# Patient Record
Sex: Male | Born: 1940
Health system: Southern US, Community
[De-identification: ages and names within clinical notes are randomized; demographics above are authoritative.]

## PROBLEM LIST (undated history)

## (undated) DIAGNOSIS — I214 Non-ST elevation (NSTEMI) myocardial infarction: Secondary | ICD-10-CM

## (undated) DIAGNOSIS — E785 Hyperlipidemia, unspecified: Secondary | ICD-10-CM

## (undated) DIAGNOSIS — I10 Essential (primary) hypertension: Secondary | ICD-10-CM

---

## 1998-05-27 ENCOUNTER — Encounter: Payer: Self-pay | Admitting: Emergency Medicine

## 1998-05-27 ENCOUNTER — Ambulatory Visit (HOSPITAL_BASED_OUTPATIENT_CLINIC_OR_DEPARTMENT_OTHER): Admission: RE | Admit: 1998-05-27 | Discharge: 1998-05-27 | Payer: Self-pay | Admitting: Orthopedic Surgery

## 1998-05-27 ENCOUNTER — Emergency Department (HOSPITAL_COMMUNITY): Admission: EM | Admit: 1998-05-27 | Discharge: 1998-05-27 | Payer: Self-pay | Admitting: Emergency Medicine

## 2003-06-16 ENCOUNTER — Ambulatory Visit (HOSPITAL_COMMUNITY): Admission: RE | Admit: 2003-06-16 | Discharge: 2003-06-16 | Payer: Self-pay | Admitting: Family Medicine

## 2003-06-25 ENCOUNTER — Ambulatory Visit (HOSPITAL_COMMUNITY): Admission: RE | Admit: 2003-06-25 | Discharge: 2003-06-25 | Payer: Self-pay | Admitting: Family Medicine

## 2003-07-24 ENCOUNTER — Ambulatory Visit (HOSPITAL_COMMUNITY): Admission: RE | Admit: 2003-07-24 | Discharge: 2003-07-24 | Payer: Self-pay | Admitting: Family Medicine

## 2003-08-03 ENCOUNTER — Ambulatory Visit (HOSPITAL_COMMUNITY): Admission: RE | Admit: 2003-08-03 | Discharge: 2003-08-03 | Payer: Self-pay | Admitting: Family Medicine

## 2003-08-15 ENCOUNTER — Encounter (INDEPENDENT_AMBULATORY_CARE_PROVIDER_SITE_OTHER): Payer: Self-pay | Admitting: *Deleted

## 2003-08-15 ENCOUNTER — Inpatient Hospital Stay (HOSPITAL_COMMUNITY): Admission: RE | Admit: 2003-08-15 | Discharge: 2003-08-23 | Payer: Self-pay | Admitting: Thoracic Surgery

## 2003-08-29 ENCOUNTER — Encounter: Admission: RE | Admit: 2003-08-29 | Discharge: 2003-08-29 | Payer: Self-pay | Admitting: Thoracic Surgery

## 2003-09-13 ENCOUNTER — Encounter: Admission: RE | Admit: 2003-09-13 | Discharge: 2003-09-13 | Payer: Self-pay | Admitting: Thoracic Surgery

## 2003-10-11 ENCOUNTER — Encounter: Admission: RE | Admit: 2003-10-11 | Discharge: 2003-10-11 | Payer: Self-pay | Admitting: Thoracic Surgery

## 2003-12-18 ENCOUNTER — Encounter: Admission: RE | Admit: 2003-12-18 | Discharge: 2003-12-18 | Payer: Self-pay | Admitting: Thoracic Surgery

## 2005-05-11 ENCOUNTER — Encounter: Admission: RE | Admit: 2005-05-11 | Discharge: 2005-05-11 | Payer: Self-pay | Admitting: Family Medicine

## 2005-06-19 IMAGING — CT CT CHEST W/ CM
1 of 2 series · 15 of 32 positions shown, 19 images · IV contrast (omnipaque)
Comparison: none

CLINICAL DATA: Abnormal chest x-ray.  Partial right lower lobe and right middle atelectasis.
 CT OF THE CHEST WITH CONTRAST
 Multidetector helical scans through the chest were performed after IV contrast media was given.  100 cc of Omnipaque 300 were given as the contrast media.
 At the right lung base, there is a low attenuation fluid collection present with some internal septations particularly dependently.  This collection has an attenuation of 21 Hounsfield units and in its largest axial plan measures approximately 10.5 x 13.5 cm.  The pleura does appear to be enhancing as are some of the septations and empyema is favored over loculated pleural effusion at the right lung base.  There is partial right middle lobe and right lower lobe atelectasis.  The left lung is clear.  No mediastinal or hilar adenopathy is seen.  The pulmonary arteries and thoracic aorta opacify normally.  
 IMPRESSION
 Fluid collection in the right lung base most consistent with empyema versus loculated effusion with internal septations.  There is right middle lobe and right lower lobe atelectasis as a result of this fluid collection.

[Series 2: chest routine 5.0 b30f · axial · 0.62mm/px · z∈[-428,-78]mm · 15 of 82 slices shown, 19 images]
[im 6/82  mediastinal]
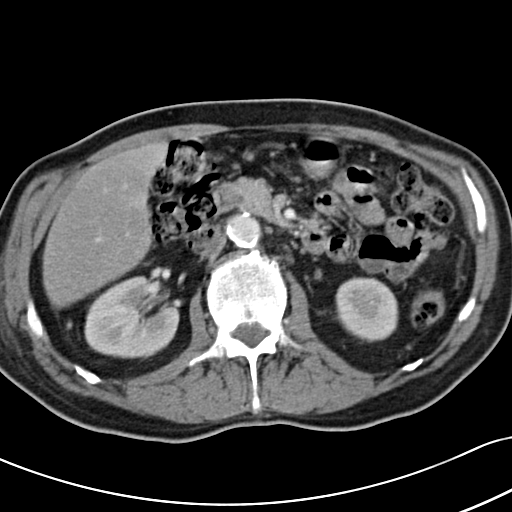
[im 6/82  lung]
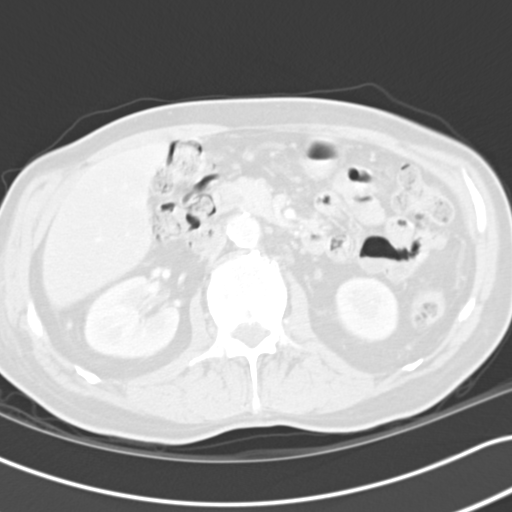
[im 11/82  lung]
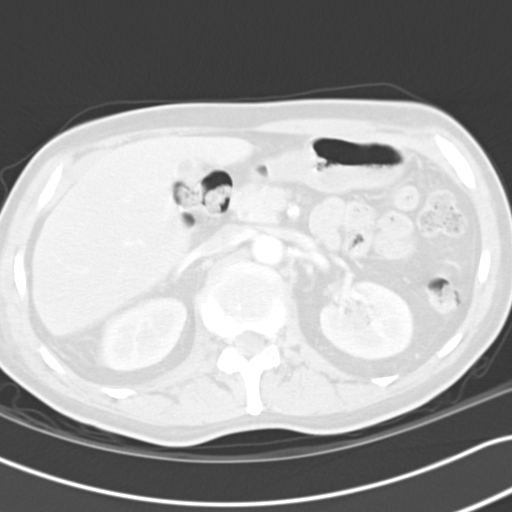
[im 17/82  lung]
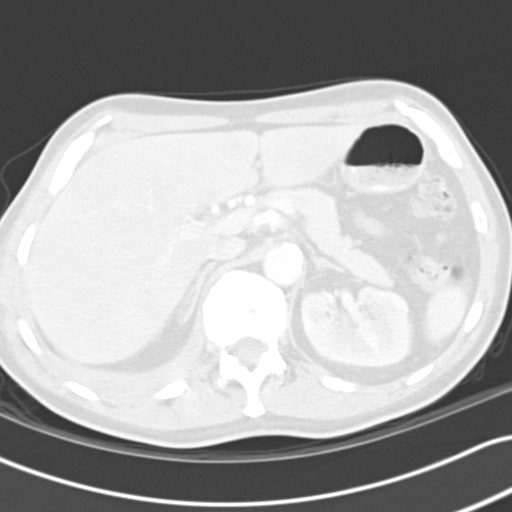
[im 22/82  lung]
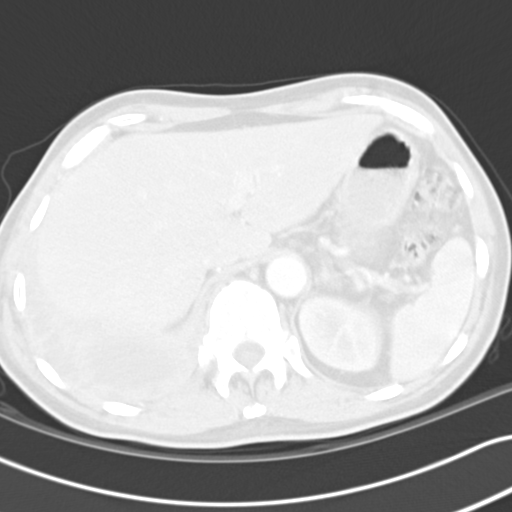
[im 28/82  mediastinal]
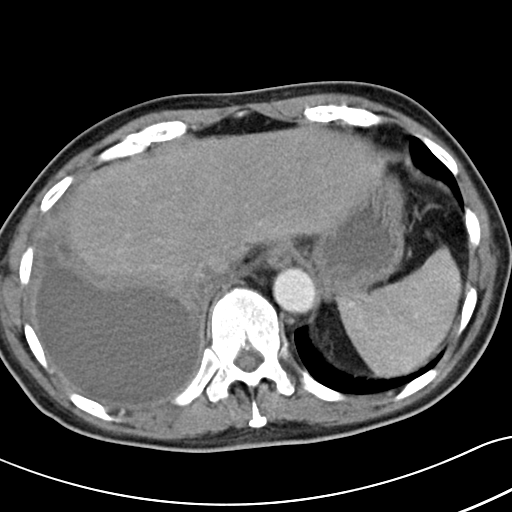
[im 28/82  lung]
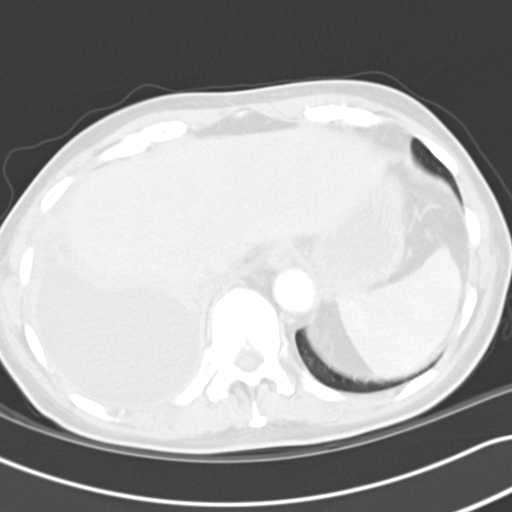
[im 33/82  lung]
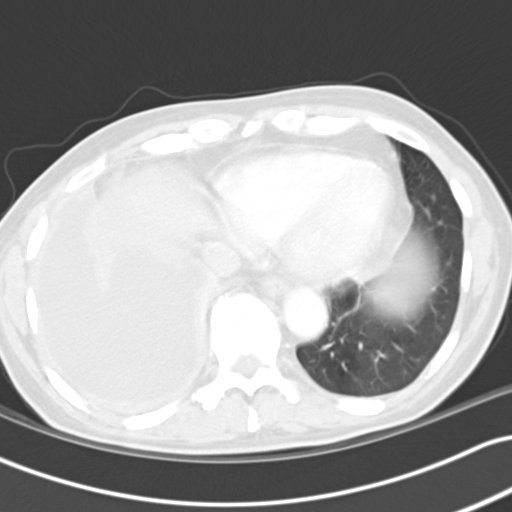
[im 38/82  lung]
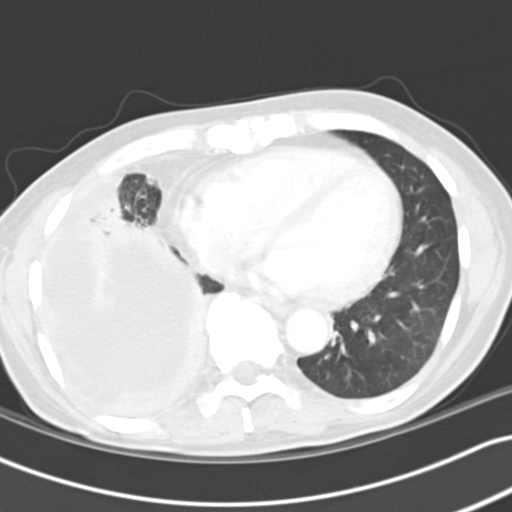
[im 41/82  lung]
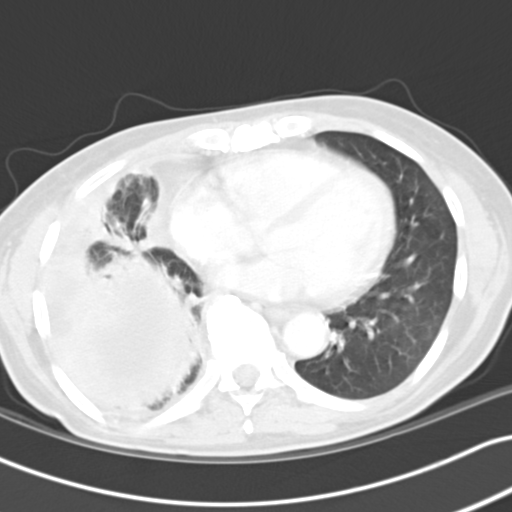
[im 44/82  mediastinal]
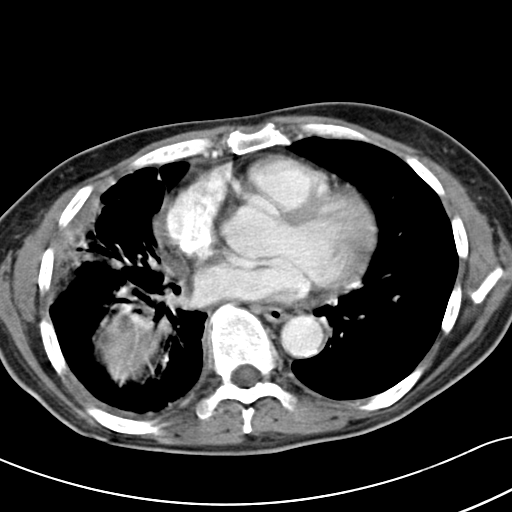
[im 44/82  lung]
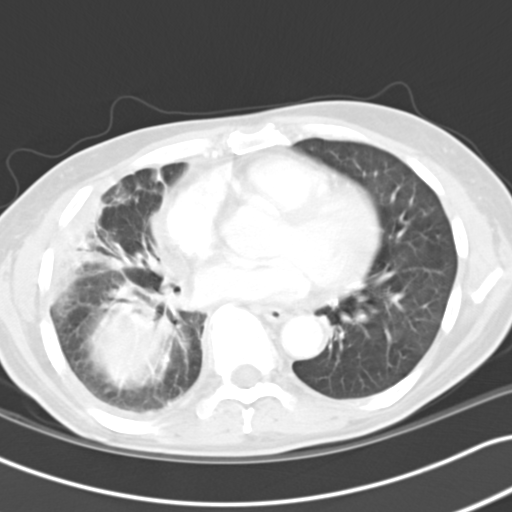
[im 49/82  lung]
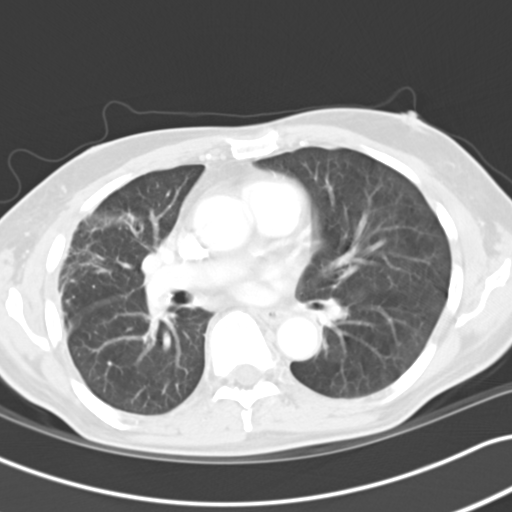
[im 55/82  lung]
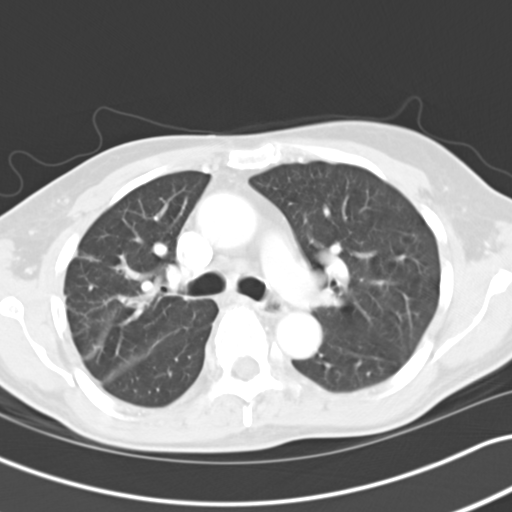
[im 60/82  lung]
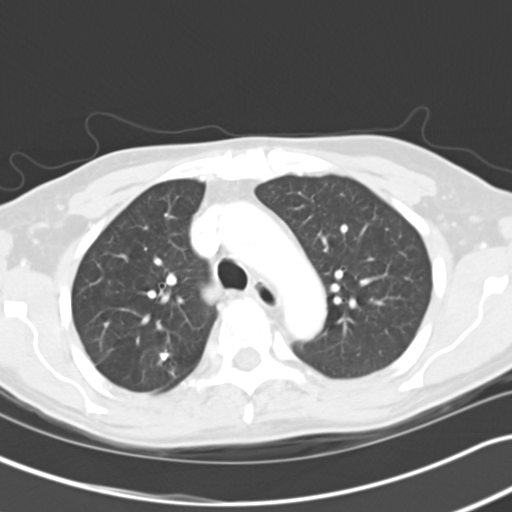
[im 65/82  mediastinal]
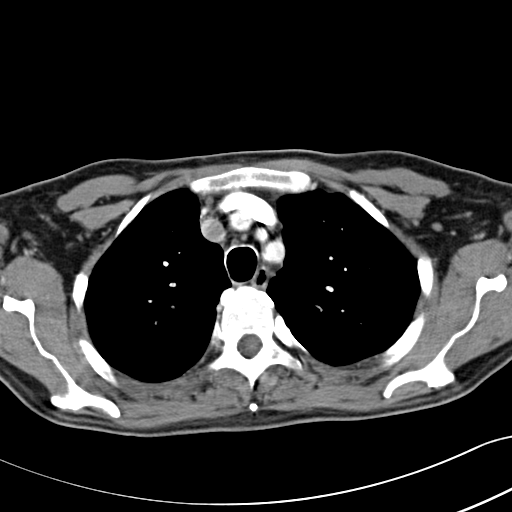
[im 65/82  lung]
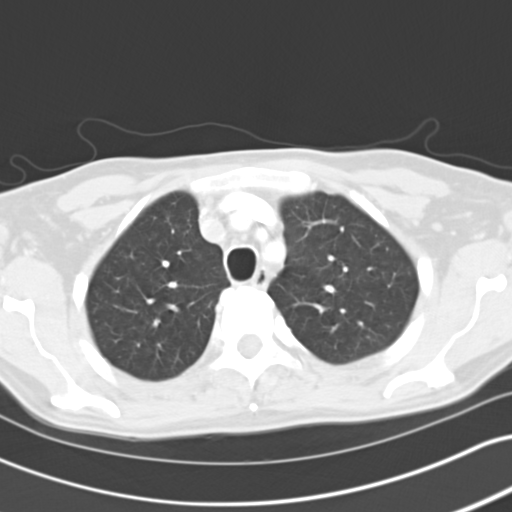
[im 71/82  lung]
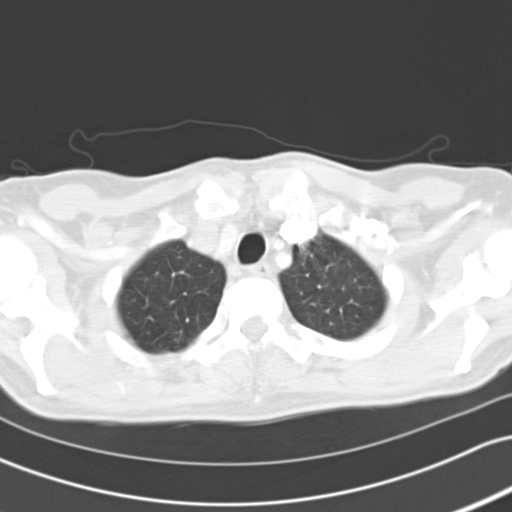
[im 76/82  lung]
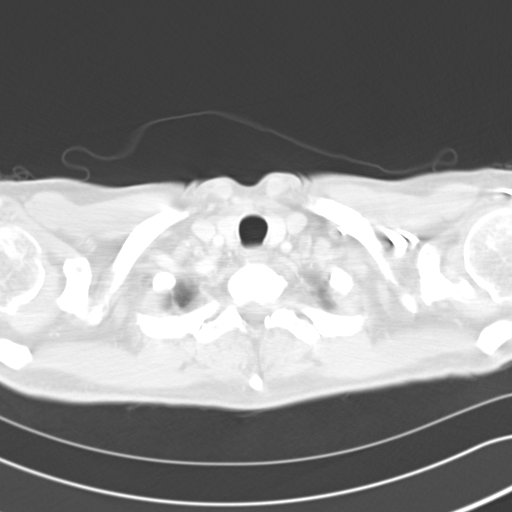

[15 of 32 positions shown; findings below may reference images not displayed]

## 2005-07-15 IMAGING — CR DG CHEST 2V
2 series · 2 of 2 positions shown · non-contrast
Comparison: none

CLINICAL DATA: Empyema with fistula, postop.
 TWO VIEW CHEST
 PA and lateral views of the chest are made and are compared to previous studies of 08/22/03 at 8445 hours from [HOSPITAL] and show decrease in the amount of fluid in the region of the right base and the right mid lung field.  There remains some blunting of the right costophrenic angle and there appears to be some pleural thickening.   The aorta is mildly elongated and minimally calcified.  The heart is not significantly enlarged.  The bones show minimal degenerative hypertrophic spurring.
 IMPRESSION
 Improved aeration, right mid and lower lung.

[view not recorded (1 of 2)]
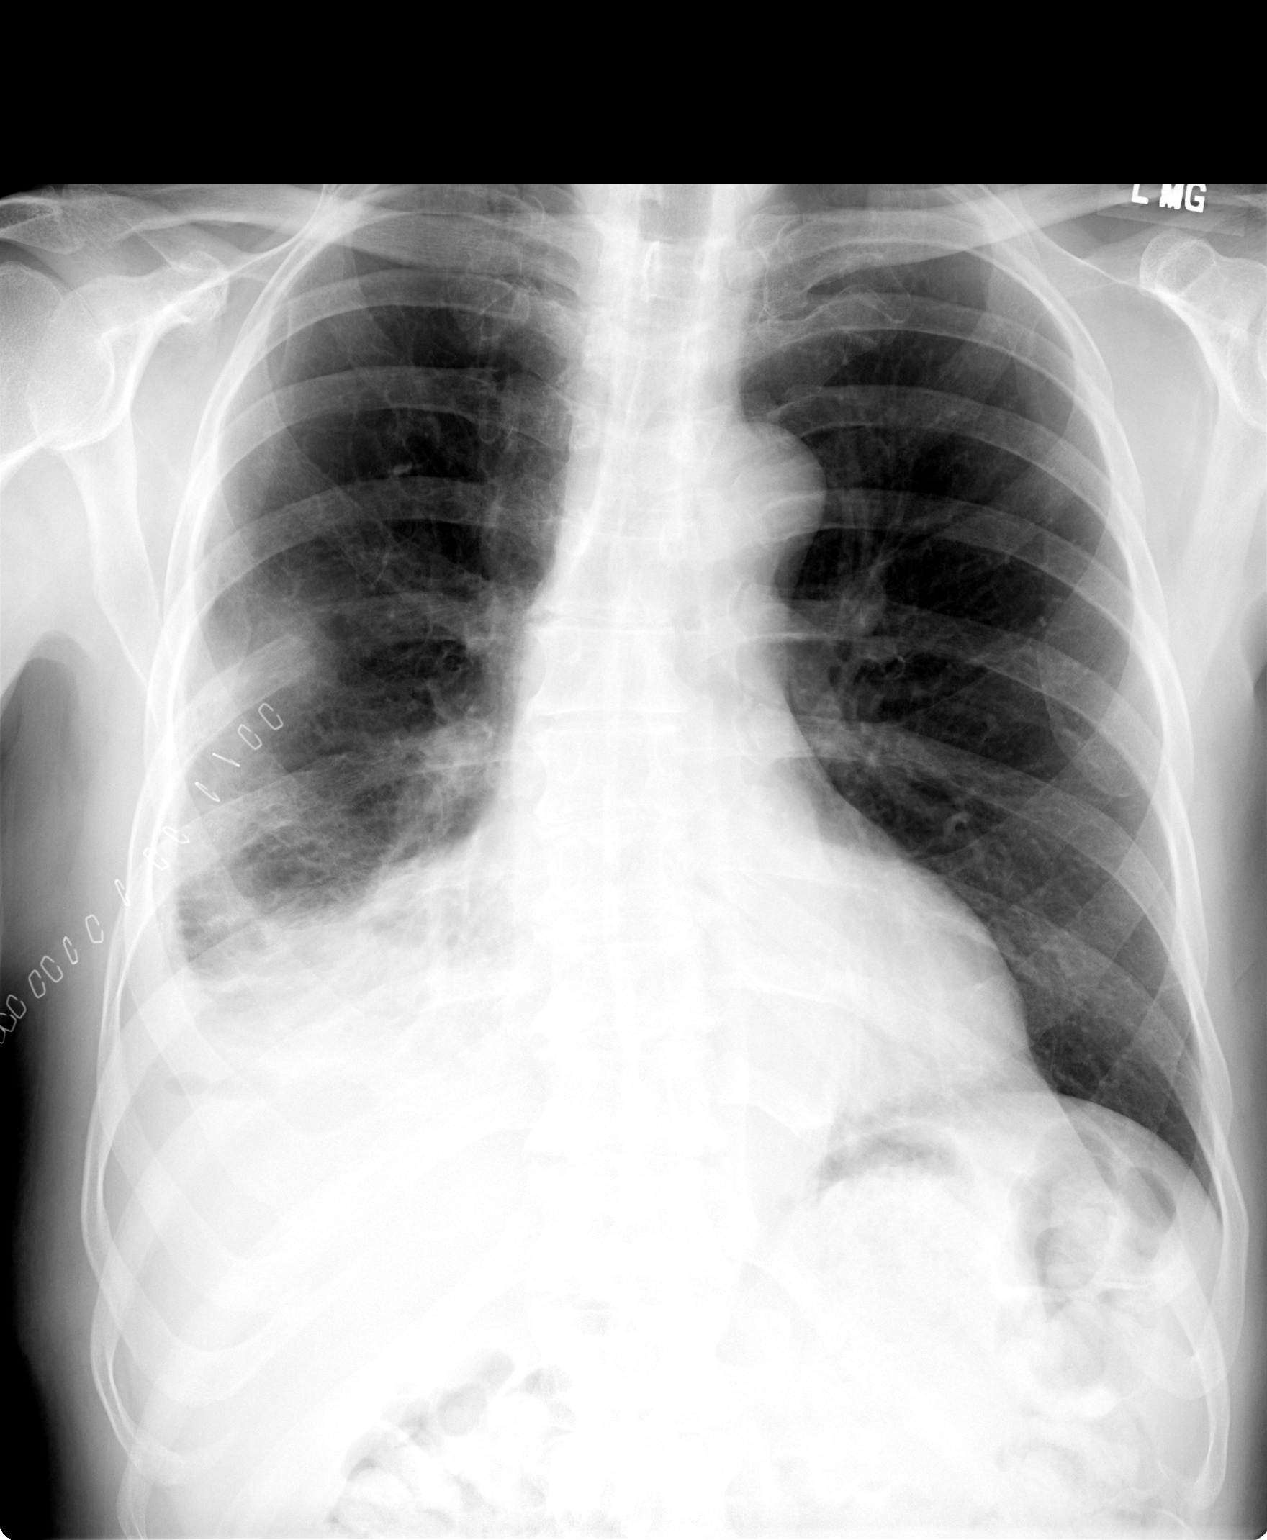

[view not recorded (2 of 2)]
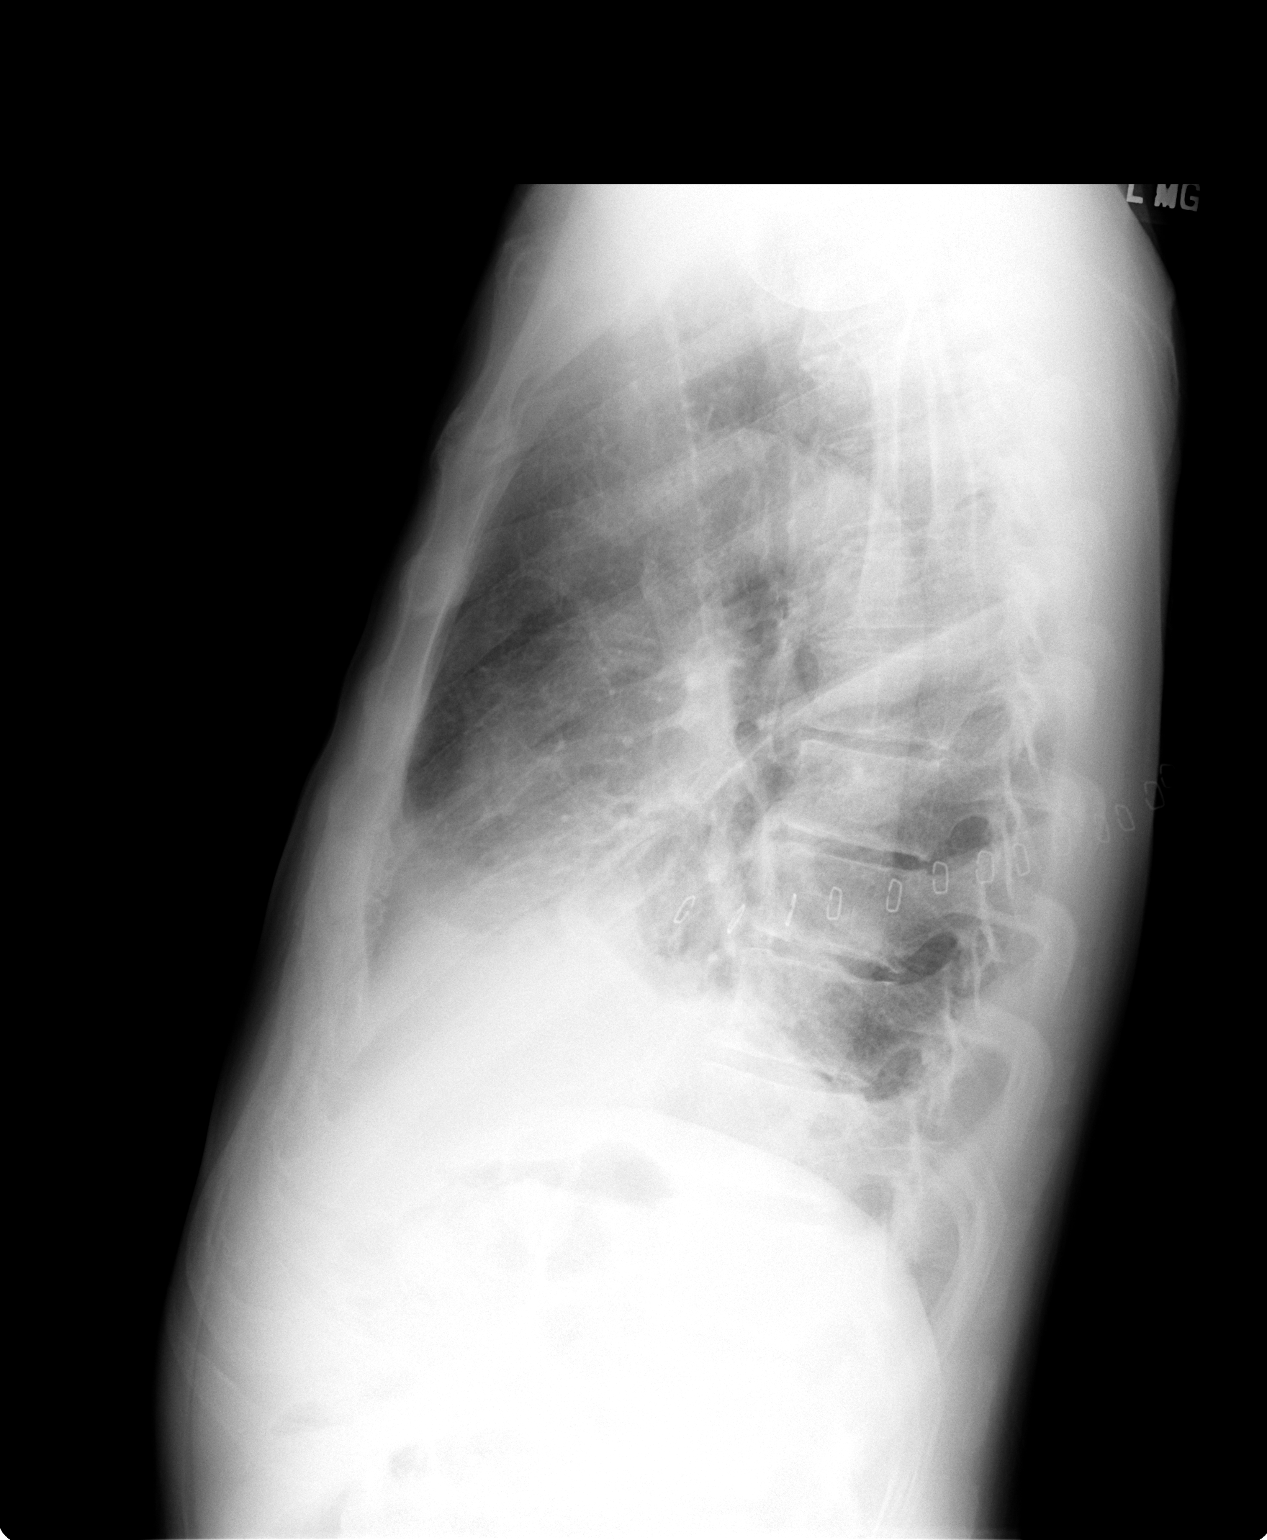

[2 of 2 positions shown; findings below may reference images not displayed]

## 2008-09-05 ENCOUNTER — Encounter: Admission: RE | Admit: 2008-09-05 | Discharge: 2008-09-05 | Payer: Self-pay | Admitting: Family Medicine

## 2008-10-18 DEATH — deceased

## 2010-12-05 NOTE — Discharge Summary (Signed)
NAMEJISHNU, [name]                         ACCOUNT NO.:  [id number]   MEDICAL RECORD NO.:  [id number]                   PATIENT TYPE:  INP   LOCATION:  2009                                 FACILITY:  [facility]   PHYSICIAN:  Ines Bloomer, M.D.              DATE OF BIRTH:  February 11, 1941   DATE OF ADMISSION:  08/15/2003  DATE OF DISCHARGE:  08/22/2003                                 DISCHARGE SUMMARY   ADMISSION DIAGNOSIS:  Right lower lobe empyema.   DISCHARGE DIAGNOSES:  1. 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  2. History of pneumonia.   HOSPITAL PROCEDURES:  1. Right video-assisted thoracoscopy, right thoracotomy, drainage of chronic     empyema and decortication of the right chest, completed on August 15, 2003 by Dr. Edwyna Shell.  2. Transfusion of packed red blood cells.   CONSULTATIONS:  1. Infectious disease.  2. Diabetes management.  3. Mobility team.   HISTORY OF PRESENT ILLNESS:  [name] is a very pleasant 70 year old male  who was referred to Dr. Edwyna Shell by Dr. Tiburcio Pea for evaluation of a right lower  lobe empyema. The patient had pneumonia in November 2004 and was treated  with antibiotics including Zithromax. The right upper lobe was cleared of  the pneumonia, but patient continued to have right middle and lower lobe  consolidation. The patient was thought to have a pleural effusion, and a CT  scan was completed for further evaluation. CT scan showed chronic empyema of  the right lower lobe. The patient continued to have intermittent chills and  felt general malaise at the time of presentation. The patient was seen by  Dr. Edwyna Shell in clinic, and he felt it was appropriate for the patient to be  admitted to [facility] for right VATS and drainage of the empyema  and decortication. The plan was for the patient to be taken to the operating  room then on August 15, 2003.   HOSPITAL COURSE:  [name] was admitted to [facility] on August 15, 2003 and was taken to the operating room. He underwent a right VATS,  right thoracotomy, drainage of chronic empyema, and decortication of the  right chest. The patient tolerated the procedure well and was transferred to  post anesthesia care unit in stable condition. Postoperatively, the patient  was found to have hemoglobin of 7.9 for which he was transfused one unit of  packed red blood cells. Otherwise, the patient's chest x-ray was stable, he  was afebrile, and his white count trended towards normal.   Over the next several hospital days, the patient continued to do fairly  well. His chest tubes were discontinued in stepwise fashion as the output  decreased, and there was no air leak. The patient's chest x-ray remained  stable. The patient's H and H continued  to trend towards normal. The patient  was saturating well on room air by postoperative day #2.   On postoperative day #5, an infectious disease consult was initiated for the  treatment plan for the patient's strep pneumoniae empyema. Dr. Roxan Hockey  responded to the consultation appropriately and recommended Rocephin during  patient's hospitalization which could be converted to an oral Keflex at the  time of discharge and to be continued for four to six weeks.   Otherwise, the patient was deemed appropriate for discharge planning on  postoperative day #6 or August 21, 2003. He continued to be afebrile, and  his vital signs were stable. He was saturating at 95% on room air. The  patient's chest x-ray showed no change in the infiltrates on exam. The  patient's heart was in a regular rate and rhythm and was in normal sinus  rhythm on telemetry. His incisions were healing well without signs of  infection. The patient was ambulating independently in the hallways without  difficulty. Plan for IV antibiotics to be continued until the patient's  discharge, and then at that  time, his antibiotics would be converted to oral  Keflex. The patient was tolerating a regular diet. His bowels were regular,  and he was urinating without difficulty.   We will continue as planned with discharge to home on postoperative day #7  or August 22, 2003 pending a.m. rounds and no change in the patient's  mental status.   ALLERGIES:  PENICILLIN reportedly causes whelps and swelling, although the  patient tolerated IV Zinacef during his hospitalization.   DISCHARGE MEDICATIONS:  1. Keflex 500 mg p.o. b.i.d. for 5 weeks.  2. Ferrous sulfate 325 mg p.o. t.i.d. for 2 weeks.  3. Tylox one to two tablets every four to six hours as needed for pain.   DISCHARGE INSTRUCTIONS:  1. Activity. The patient should avoid driving. He should avoid heavy lifting     or strenuous activity. He should continue to walk daily. He should     continue his breathing exercises.  2. Diet. The patient has no restrictions.  3. Wound care. The patient may shower. He is to wash his incisions daily     with soap and water.  4. Special instructions. The patient was to notify [facility] if he has any     increased redness, swelling, or drainage from his incisions or has a     temperature greater than 101.   FOLLOWUP APPOINTMENT:  The patient should see Dr. Edwyna Shell on Wednesday  February 9 at 4:30 p.m. with a chest x-ray at [facility]  on that same day at 3:30 p.m. The patient is to hand carry the chest x-ray  to Dr. Scheryl Darter appointment.      Carolyn A. Eustaquio Boyden.                  Ines Bloomer, M.D.    CAF/MEDQ  D:  08/21/2003  T:  08/22/2003  Job:  [id number]   cc:   Tiburcio Pea, M.D.

## 2010-12-05 NOTE — Op Note (Signed)
NAME:  [name], [name]                       ACCOUNT NO.:  [id number]   MEDICAL RECORD NO.:  [id number]                   PATIENT TYPE:  INP   LOCATION:  2009                                 FACILITY:  [facility]   PHYSICIAN:  Ines Bloomer, M.D.              DATE OF BIRTH:  01-14-1941   DATE OF PROCEDURE:  08/15/2003  DATE OF DISCHARGE:  08/23/2003                                 OPERATIVE REPORT   PREOPERATIVE DIAGNOSIS:  Right chest empyema.   POSTOPERATIVE DIAGNOSIS:  Right chest empyema.   OPERATION PERFORMED:  Right video assisted thoracoscopic surgery with right  thoracotomy decortication and drainage of empyema.   SURGEON:  Ines Bloomer, M.D.   FIRST ASSISTANT:  Toribio Harbour, N.P.   After the percutaneous insertion of all monitoring lines, the patient  underwent general anesthesia and was prepped and draped in the usual sterile  manner and turned to the right lateral thoracotomy position.  A dual lumen  tube was inserted.  The patient had a right lower lobe empyema that was  diagnosed by CT scan as well as fevers with multiple loculations.  He had  previously been on antibiotics.  Two trocar sites were made in the anterior  to posterior at the seventh intercostal space.  Two trocars were inserted.  Pictures were taken because the patient had a marked empyema.  Using ring  forceps, the loculations were freed up and the fluid was sent for culture as  well as the exudate sent for culture.  The right lower lobe was freed up  from the lateral chest wall and then the right middle lobe and anterior  segment of the right upper lobe was freed off the anterior mediastinum.  Slowly, the right lower lobe was freed off the diaphragm.  Then, using the  scope, a 30 degree scope was guided into the right upper lobe which was  freed off the apex.  However, there was still marked exudate on the right  lower lobe and right middle lobe and, somewhat, on the posterior segment of  the right upper lobe.  A posterolateral thoracotomy was made in the sixth  intercostal space, the latissimus was partially divided and the serratus was  reflected anteriorly.  The sixth intercostal space was entered.  Two  Tuffier's were placed at right angles.  Attention was turned to the right  lower lobe.  It was freed up from along the fissure and then along the  diaphragm and then posteriorly.  All exudate was removed from the right  costophrenic angle.  Then, decortication was done on the right lower lobe  using sharp and blunt dissection to remove the exudate from the right lower  lobe.  Several small tears in the lung were repaired with 3-0 Vicryl in an  interrupted fashion.  This was a very tedious process and took at least 30  minutes to remove all the exudates from the  right lower lobe.  Attention was  turned to the right middle lobe and it was freed up off the mediastinum and  the pericardium and, again, decortication was done removing the tissue with  sharp and blunt dissection using DeBakeys and Russians as well as using  Kitners to do the dissection.  The exudate was also freed out of the minor  fissure.  Finally, attention was turned to the right upper lobe and the  right upper lobe which had been freed completely from the apex ands  mediastinum was debrided and decortication done removing the exudate off the  lobe as previously described.  After all the debris had been removed, three  chest tubes were placed.  Through the anterior and posterior trocar sites we  placed two straight 36 tubes and between these two, an incision was made and  a right angle chest tube was placed in the right costophrenic angle.  All  bleeding was electrocoagulated.  The chest was closed with  three pericostals.  The on cue catheters were placed underneath the  pericostals.  Marcaine block was done in the usual fashion.  The muscle was  closed with #1 Vicryl, the subcutaneous tissue with 2-0  Vicryl, and  Dermabond for the skin.  The patient was returned to the recovery room in  stable condition.                                               Ines Bloomer, M.D.    DPB/MEDQ  D:  10/24/2003  T:  10/24/2003  Job:  [id number]

## 2010-12-05 NOTE — H&P (Signed)
[name], [name]                         ACCOUNT NO.:  [id number]   MEDICAL RECORD NO.:  [id number]                   PATIENT TYPE:  INP   LOCATION:  NA                                   FACILITY:  [facility]   PHYSICIAN:  Ines Bloomer, M.D.              DATE OF BIRTH:  1941-02-03   DATE OF ADMISSION:  DATE OF DISCHARGE:                                HISTORY & PHYSICAL   PRIMARY CARE PHYSICIAN:  Dr. Tiburcio Pea.   CHIEF COMPLAINT:  Right lower lobe empyema.   HISTORY OF PRESENT ILLNESS:  This is a 70 year old male referred by Dr.  Tiburcio Pea for evaluation of a right lower lobe empyema. The patient had  pneumonia in November 2004 and was treated with antibiotics.  Initially, the  pneumonia involved the right upper and right lower lobes.  Antibiotic  treatment included Zithromax which lead to a clearing of the right upper  lobe but the pneumonia remained in the right middle and lower lobes.  The  patient was thought to have a pleural effusion.  CT scan revealed chronic  empyema of the right lower lobe.   The patient continues to have intermittent chills and feels poorly in  general.  The patient has had some chronic pain in the right chest and takes  Aleve for it.  PFTs revealed an FVC of 1.62 with FEV1 of 1.58, which  indicates obstructive disease.  The patient does complain of nonproductive  cough, chills, unexplained weight loss of 35 pounds over the past 3 months,  GERD symptoms, dyspnea on exertion, and occasional dysuria.  The patient  denied fever, paroxysmal nocturnal dyspnea, angina, arrhythmia, peripheral  edema, hemoptysis, symptoms of TIA, CVA, amaurosis fugax, PE or DVT.   PAST MEDICAL HISTORY:  Insignificant.   PAST SURGICAL HISTORY:  Repair of the right hand after trauma.   MEDICATIONS:  1. Ultracet.  2. Inhaler of unknown type b.i.d. - which the patient has stopped using.   ALLERGIES:  PENICILLIN, TO WHICH THE PATIENT DEVELOPS WELTS AND SWELLING.   REVIEW OF  SYSTEMS:  See HPI for significant positives and negatives.   FAMILY HISTORY:  Mother glaucoma, father myocardial infarction.   SOCIAL HISTORY:  This is a married male with 3 children.  He is retired.  He  does drink wine 3 times a week, and he quit smoking in 1961 after smoking  for 2 years.   PHYSICAL EXAMINATION:  VITAL SIGNS:  Blood pressure 122/80, pulse 96,  respirations 18.  GENERAL:  This is a 70 year old white male in no acute  distress. He is alert and oriented x3.  HEENT:  Normocephalic, atraumatic,  pupils are equal, round and reactive to light, extraocular movements intact.  There is no evidence of cataracts, glaucoma or macular degeneration.  NECK:  Supple, no JVD, no bruits, no lymphadenopathy.  CHEST:  Symmetrical on  inspiration.  LUNGS:  No wheezes, rhonchi or rales.  There are decreased  breath sounds in the right base.  HEART:  Regular rate and rhythm, no  murmurs, rubs or gallops.  ABDOMEN:  Soft, bowel sounds present x4. No  masses, no bruits. The patient is slightly tender over the epigastrium.  GENITOURINARY/RECTAL:  Deferred.  EXTREMITIES:  No clubbing, cyanosis or  edema, no ulcerations.  Temperature warm. Carotid pulses 2+ bilaterally.  Femoral, popliteal and posterior tibial pulses are 2+ bilaterally.  NEUROLOGIC: Nonfocal, gait is steady, deep tendon reflexes 3+, muscle  strength is 5/5.   ASSESSMENT:  Right lower lobe empyema.   PLAN:  Right video-assisted thoracoscopic surgery with drainage of the  empyema at [facility] on August 15, 2003 by  Dr. Edwyna Shell.  Dr.  Edwyna Shell has seen and evaluated this patient prior to this admission and he  has explained the risks and benefits of the procedure, and patient has  agreed to continue.      Pecola Leisure, [place]                      Ines Bloomer, M.D.    AY/MEDQ  D:  08/13/2003  T:  08/13/2003  Job:  [id number]

## 2012-04-10 DIAGNOSIS — Z23 Encounter for immunization: Secondary | ICD-10-CM | POA: Diagnosis not present

## 2012-10-18 DIAGNOSIS — R071 Chest pain on breathing: Secondary | ICD-10-CM | POA: Diagnosis not present

## 2012-10-18 DIAGNOSIS — Z1331 Encounter for screening for depression: Secondary | ICD-10-CM | POA: Diagnosis not present

## 2012-10-18 DIAGNOSIS — E785 Hyperlipidemia, unspecified: Secondary | ICD-10-CM | POA: Diagnosis not present

## 2012-10-18 DIAGNOSIS — Z125 Encounter for screening for malignant neoplasm of prostate: Secondary | ICD-10-CM | POA: Diagnosis not present

## 2012-10-18 DIAGNOSIS — Z0000 Encounter for general adult medical examination without abnormal findings: Secondary | ICD-10-CM | POA: Diagnosis not present

## 2012-12-01 DIAGNOSIS — K5730 Diverticulosis of large intestine without perforation or abscess without bleeding: Secondary | ICD-10-CM | POA: Diagnosis not present

## 2012-12-01 DIAGNOSIS — Z1211 Encounter for screening for malignant neoplasm of colon: Secondary | ICD-10-CM | POA: Diagnosis not present

## 2012-12-01 DIAGNOSIS — K648 Other hemorrhoids: Secondary | ICD-10-CM | POA: Diagnosis not present

## 2012-12-19 DIAGNOSIS — H2510 Age-related nuclear cataract, unspecified eye: Secondary | ICD-10-CM | POA: Diagnosis not present

## 2013-04-13 DIAGNOSIS — Z23 Encounter for immunization: Secondary | ICD-10-CM | POA: Diagnosis not present

## 2013-12-14 DIAGNOSIS — E785 Hyperlipidemia, unspecified: Secondary | ICD-10-CM | POA: Diagnosis not present

## 2013-12-14 DIAGNOSIS — R079 Chest pain, unspecified: Secondary | ICD-10-CM | POA: Diagnosis not present

## 2014-03-29 DIAGNOSIS — Z23 Encounter for immunization: Secondary | ICD-10-CM | POA: Diagnosis not present

## 2015-04-12 DIAGNOSIS — Z23 Encounter for immunization: Secondary | ICD-10-CM | POA: Diagnosis not present

## 2016-01-02 DIAGNOSIS — H524 Presbyopia: Secondary | ICD-10-CM | POA: Diagnosis not present

## 2016-01-02 DIAGNOSIS — H26491 Other secondary cataract, right eye: Secondary | ICD-10-CM | POA: Diagnosis not present

## 2016-01-02 DIAGNOSIS — H25812 Combined forms of age-related cataract, left eye: Secondary | ICD-10-CM | POA: Diagnosis not present

## 2016-04-10 DIAGNOSIS — Z23 Encounter for immunization: Secondary | ICD-10-CM | POA: Diagnosis not present

## 2017-04-19 DIAGNOSIS — Z23 Encounter for immunization: Secondary | ICD-10-CM | POA: Diagnosis not present

## 2017-08-16 ENCOUNTER — Encounter (HOSPITAL_COMMUNITY): Payer: Self-pay

## 2017-08-16 ENCOUNTER — Other Ambulatory Visit: Payer: Self-pay

## 2017-08-16 ENCOUNTER — Emergency Department (HOSPITAL_COMMUNITY): Payer: Medicare Other

## 2017-08-16 ENCOUNTER — Inpatient Hospital Stay (HOSPITAL_COMMUNITY)
Admission: EM | Admit: 2017-08-16 | Discharge: 2017-08-18 | DRG: 246 | Disposition: A | Discharge status: Home / Self Care | Payer: Medicare Other | Attending: Cardiovascular Disease | Admitting: Cardiovascular Disease

## 2017-08-16 DIAGNOSIS — Z91018 Allergy to other foods: Secondary | ICD-10-CM

## 2017-08-16 DIAGNOSIS — R7989 Other specified abnormal findings of blood chemistry: Secondary | ICD-10-CM | POA: Diagnosis not present

## 2017-08-16 DIAGNOSIS — R1013 Epigastric pain: Secondary | ICD-10-CM | POA: Diagnosis not present

## 2017-08-16 DIAGNOSIS — I10 Essential (primary) hypertension: Secondary | ICD-10-CM | POA: Diagnosis not present

## 2017-08-16 DIAGNOSIS — Z79899 Other long term (current) drug therapy: Secondary | ICD-10-CM

## 2017-08-16 DIAGNOSIS — Z955 Presence of coronary angioplasty implant and graft: Secondary | ICD-10-CM

## 2017-08-16 DIAGNOSIS — Z880 Allergy status to penicillin: Secondary | ICD-10-CM | POA: Diagnosis not present

## 2017-08-16 DIAGNOSIS — Z7982 Long term (current) use of aspirin: Secondary | ICD-10-CM

## 2017-08-16 DIAGNOSIS — I2510 Atherosclerotic heart disease of native coronary artery without angina pectoris: Secondary | ICD-10-CM | POA: Diagnosis not present

## 2017-08-16 DIAGNOSIS — I214 Non-ST elevation (NSTEMI) myocardial infarction: Secondary | ICD-10-CM | POA: Diagnosis not present

## 2017-08-16 DIAGNOSIS — I160 Hypertensive urgency: Secondary | ICD-10-CM | POA: Diagnosis present

## 2017-08-16 DIAGNOSIS — Z9109 Other allergy status, other than to drugs and biological substances: Secondary | ICD-10-CM

## 2017-08-16 DIAGNOSIS — R079 Chest pain, unspecified: Secondary | ICD-10-CM

## 2017-08-16 DIAGNOSIS — Z87891 Personal history of nicotine dependence: Secondary | ICD-10-CM | POA: Diagnosis not present

## 2017-08-16 DIAGNOSIS — R748 Abnormal levels of other serum enzymes: Secondary | ICD-10-CM | POA: Diagnosis not present

## 2017-08-16 DIAGNOSIS — E785 Hyperlipidemia, unspecified: Secondary | ICD-10-CM | POA: Diagnosis not present

## 2017-08-16 DIAGNOSIS — I491 Atrial premature depolarization: Secondary | ICD-10-CM | POA: Diagnosis present

## 2017-08-16 DIAGNOSIS — R0789 Other chest pain: Secondary | ICD-10-CM | POA: Diagnosis not present

## 2017-08-16 DIAGNOSIS — Z8249 Family history of ischemic heart disease and other diseases of the circulatory system: Secondary | ICD-10-CM

## 2017-08-16 DIAGNOSIS — R778 Other specified abnormalities of plasma proteins: Secondary | ICD-10-CM

## 2017-08-16 HISTORY — DX: Hyperlipidemia, unspecified: E78.5

## 2017-08-16 HISTORY — DX: Non-ST elevation (NSTEMI) myocardial infarction: I21.4

## 2017-08-16 HISTORY — DX: Essential (primary) hypertension: I10

## 2017-08-16 LAB — BASIC METABOLIC PANEL
Anion gap: 11 (ref 5–15)
BUN: 20 mg/dL (ref 6–20)
CALCIUM: 9.3 mg/dL (ref 8.9–10.3)
CO2: 26 mmol/L (ref 22–32)
CREATININE: 1.03 mg/dL (ref 0.61–1.24)
Chloride: 102 mmol/L (ref 101–111)
GFR calc Af Amer: >60 mL/min (ref >60)
GFR calc non Af Amer: >60 mL/min (ref >60)
GLUCOSE: 126 mg/dL — AB (ref 65–99)
Potassium: 3.7 mmol/L (ref 3.5–5.1)
Sodium: 139 mmol/L (ref 135–145)

## 2017-08-16 LAB — I-STAT TROPONIN, ED
Troponin i, poc: 0.37 ng/mL (ref 0.00–0.08)
Troponin i, poc: 0.66 ng/mL (ref 0.00–0.08)

## 2017-08-16 LAB — CBC
HCT: 44.4 % (ref 39.0–52.0)
Hemoglobin: 14.9 g/dL (ref 13.0–17.0)
MCH: 30.9 pg (ref 26.0–34.0)
MCHC: 33.6 g/dL (ref 30.0–36.0)
MCV: 92.1 fL (ref 78.0–100.0)
PLATELETS: 181 10*3/uL (ref 150–400)
RBC: 4.82 MIL/uL (ref 4.22–5.81)
RDW: 13.8 % (ref 11.5–15.5)
WBC: 8.3 10*3/uL (ref 4.0–10.5)

## 2017-08-16 LAB — TROPONIN I: Troponin I: 1.58 ng/mL (ref <0.03)

## 2017-08-16 MED ORDER — ONDANSETRON HCL 4 MG/2ML IJ SOLN: 4.0000 mg | Freq: Four times a day (QID) | INTRAMUSCULAR | Status: DC | PRN

## 2017-08-16 MED ORDER — NITROGLYCERIN IN D5W 200-5 MCG/ML-% IV SOLN
0.0000 ug/min | Freq: Once | INTRAVENOUS | Status: AC
  Administered 2017-08-16: 5 ug/min via INTRAVENOUS
  Filled 2017-08-16: qty 250

## 2017-08-16 MED ORDER — HYDRALAZINE HCL 20 MG/ML IJ SOLN: 10.0000 mg | Freq: Four times a day (QID) | INTRAMUSCULAR | Status: DC | PRN

## 2017-08-16 MED ORDER — HEPARIN BOLUS VIA INFUSION
4000.0000 [IU] | Freq: Once | INTRAVENOUS | Status: AC
  Administered 2017-08-16: 4000 [IU] via INTRAVENOUS
  Filled 2017-08-16: qty 4000

## 2017-08-16 MED ORDER — CARVEDILOL 3.125 MG PO TABS
6.2500 mg | ORAL_TABLET | Freq: Two times a day (BID) | ORAL | Status: DC
  Administered 2017-08-16 – 2017-08-18 (×4): 6.25 mg via ORAL
  Filled 2017-08-16: qty 1
  Filled 2017-08-16 (×2): qty 2
  Filled 2017-08-16: qty 1

## 2017-08-16 MED ORDER — SODIUM CHLORIDE 0.9 % WEIGHT BASED INFUSION
1.0000 mL/kg/h | INTRAVENOUS | Status: DC
  Administered 2017-08-16: 1 mL/kg/h via INTRAVENOUS
  Administered 2017-08-17: 250 mL via INTRAVENOUS

## 2017-08-16 MED ORDER — ASPIRIN EC 81 MG PO TBEC
81.0000 mg | DELAYED_RELEASE_TABLET | Freq: Every day | ORAL | Status: DC
  Administered 2017-08-18: 81 mg via ORAL
  Filled 2017-08-16: qty 1

## 2017-08-16 MED ORDER — TRAMADOL HCL 50 MG PO TABS: 50.0000 mg | ORAL_TABLET | Freq: Three times a day (TID) | ORAL | Status: DC | PRN

## 2017-08-16 MED ORDER — ATORVASTATIN CALCIUM 80 MG PO TABS
80.0000 mg | ORAL_TABLET | Freq: Every day | ORAL | Status: DC
  Administered 2017-08-17: 80 mg via ORAL
  Filled 2017-08-16: qty 1

## 2017-08-16 MED ORDER — ACETAMINOPHEN 325 MG PO TABS: 650.0000 mg | ORAL_TABLET | ORAL | Status: DC | PRN

## 2017-08-16 MED ORDER — ASPIRIN 81 MG PO CHEW
324.0000 mg | CHEWABLE_TABLET | Freq: Once | ORAL | Status: AC
  Administered 2017-08-16: 324 mg via ORAL
  Filled 2017-08-16: qty 4

## 2017-08-16 MED ORDER — NITROGLYCERIN 0.4 MG SL SUBL: 0.4000 mg | SUBLINGUAL_TABLET | SUBLINGUAL | Status: DC | PRN

## 2017-08-16 MED ORDER — HEPARIN BOLUS VIA INFUSION: 4000.0000 [IU] | Freq: Once | INTRAVENOUS | Status: DC

## 2017-08-16 MED ORDER — ASPIRIN 81 MG PO CHEW
81.0000 mg | CHEWABLE_TABLET | Freq: Once | ORAL | Status: AC
  Administered 2017-08-17: 81 mg via ORAL
  Filled 2017-08-16: qty 1

## 2017-08-16 MED ORDER — SODIUM CHLORIDE 0.9 % IV SOLN: 250.0000 mL | INTRAVENOUS | Status: DC | PRN

## 2017-08-16 MED ORDER — HEPARIN (PORCINE) IN NACL 100-0.45 UNIT/ML-% IJ SOLN
1000.0000 [IU]/h | INTRAMUSCULAR | Status: DC
  Administered 2017-08-16: 900 [IU]/h via INTRAVENOUS
  Filled 2017-08-16 (×2): qty 250

## 2017-08-16 MED ORDER — PANTOPRAZOLE SODIUM 40 MG PO TBEC
40.0000 mg | DELAYED_RELEASE_TABLET | Freq: Every day | ORAL | Status: DC
  Administered 2017-08-17 – 2017-08-18 (×2): 40 mg via ORAL
  Filled 2017-08-16 (×2): qty 1

## 2017-08-16 MED ORDER — AMLODIPINE BESYLATE 5 MG PO TABS
5.0000 mg | ORAL_TABLET | Freq: Every day | ORAL | Status: DC
  Administered 2017-08-17 – 2017-08-18 (×3): 5 mg via ORAL
  Filled 2017-08-16 (×4): qty 1

## 2017-08-16 NOTE — Progress Notes (Signed)
Patient's Troponin resulted at 1.58, previous POC Troponin's were 0.37 and 0.66. Patient is chest pain free, IV heparin infusing.  Cardiology paged with this information.

## 2017-08-16 NOTE — H&P (View-Only) (Signed)
Cardiology Admission History and Physical:   Patient ID: [name]; MRN: [id number]; DOB: 03-13-1941   Admission date: 08/16/2017  Primary Care Provider: Patient, No Pcp Per Primary Cardiologist: No primary care provider on file. New Primary Electrophysiologist:  n/a  Chief Complaint:  Unstable angina  Patient Profile:   [name] is a 77 y.o. male with a history of "mild" hypertension who presents with a couple of weeks of exertional chest discomfort.  He has repolarization of normality's on his ECG and mildly abnormal cardiac troponin I.  History of Present Illness:   [name] has a history of mildly elevated high blood pressure, but has never taken medications for it.  He denies any other serious medical problems.  Over the last couple of weeks he has had intermittent exertion related retrosternal burning radiating to his arms.  It has rarely also occurred at rest, but usually happens when he is working in the yard or feeding his animals.  The pattern does appear to be accelerating.  He denies dyspnea, palpitations, dizziness or syncope, headache, visual changes or focal neurological complaints, leg edema, claudication or abdominal pain.  Found to be severely hypertensive with blood pressure hovering around 220/120 mmHg.  However he is currently asymptomatic and appears quite comfortable.  His electrocardiogram shows widely diffuse ST segment depression and T wave inversion in almost all leads.  His cardiac troponin I was 0.37.  He has normal potassium level and normal renal parameters.  His chest x-ray shows mild cardiomegaly and blunting of the right costophrenic angle which is a chronic abnormality.  The chest CT performed in 2010 showed scattered coronary calcifications as well as atherosclerotic calcification at the ostia of both renal arteries.  He smoked briefly when in the [employer] in the early 1960s.  He does not have a strong family history of premature coronary  vascular disease.  He does not have diabetes mellitus.  He takes omega-3 fatty acid supplements to help his lipid profile, but we do not have details regarding his cholesterol.   Past Medical History:  Diagnosis Date  . Hypertension     History reviewed. No pertinent surgical history.   Medications Prior to Admission: Prior to Admission medications   Medication Sig Start Date End Date Taking? Authorizing Provider  aspirin EC 81 MG tablet Take 162 mg by mouth daily.   Yes [provider]  Multiple Vitamins-Minerals (ONE-A-DAY 50 PLUS PO) Take 1 tablet by mouth daily.   Yes [provider]  Omega-3 Fatty Acids (ULTRA OMEGA-3 FISH OIL) 1400 MG CAPS Take 1 capsule by mouth daily.   Yes [provider]  ranitidine (ZANTAC) 75 MG tablet Take 75 mg by mouth daily as needed for heartburn.   Yes [provider]  traMADol (ULTRAM) 50 MG tablet Take 50 mg by mouth every 8 (eight) hours as needed (for pain).   Yes [provider]     Allergies:    Allergies  Allergen Reactions  . Orange Fruit [Citrus] Other (See Comments)    Reflux   . Penicillins Swelling    Welts all over also Has patient had a PCN reaction causing immediate rash, facial/tongue/throat swelling, SOB or lightheadedness with hypotension: Yes Has patient had a PCN reaction causing severe rash involving mucus membranes or skin necrosis: No Has patient had a PCN reaction that required hospitalization: No Has patient had a PCN reaction occurring within the last 10 years: No If all of the above answers are "NO", then  may proceed with Cephalosporin use.   [name] [Alcohol] Other (See Comments)    Reflux     Social History:   Social History   Socioeconomic History  . Marital status: Single    Spouse name: Not on file  . Number of children: Not on file  . Years of education: Not on file  . Highest education level: Not on file  Social Needs  . Financial resource strain: Not on  file  . Food insecurity - worry: Not on file  . Food insecurity - inability: Not on file  . Transportation needs - medical: Not on file  . Transportation needs - non-medical: Not on file  Occupational History  . Not on file  Tobacco Use  . Smoking status: Never Smoker  . Smokeless tobacco: Never Used  Substance and Sexual Activity  . Alcohol use: No    Frequency: Never  . Drug use: Not on file  . Sexual activity: No  Other Topics Concern  . Not on file  Social History Narrative  . Not on file    Family History:   The patient's family history significantly negative for premature coronary artery vascular problems  ROS:  Please see the history of present illness.  All other ROS reviewed and negative.     Physical Exam/Data:   Vitals:   08/16/17 1700 08/16/17 1730 08/16/17 1800 08/16/17 1849  BP: (!) 179/106 (!) 180/108 (!) 191/128   Pulse: 94 81 86   Resp: (!) 25 (!) 23 (!) 23   Temp:      TempSrc:      SpO2: 97% 96% 99%   Weight:   163 lb (73.9 kg) 163 lb (73.9 kg)  Height:   5\' 9"  (1.753 m) 5\' 9"  (1.753 m)   No intake or output data in the 24 hours ending 08/16/17 1854 Filed Weights   08/16/17 1800 08/16/17 1849  Weight: 163 lb (73.9 kg) 163 lb (73.9 kg)   Body mass index is 24.07 kg/m.  General:  Well nourished, well developed, in no acute distress; he looks remarkably fit and wiry for his age HEENT: normal Lymph: no adenopathy Neck: no JVD Endocrine:  No thryomegaly Vascular: No carotid bruits; FA pulses 2+ bilaterally without bruits ; he does not have any abdominal bruits; he has 2+ pulses in the dorsalis pedis and posterior tibials bilaterally. Cardiac:  normal S1, S2; RRR; no murmur  Lungs:  clear to auscultation bilaterally, no wheezing, rhonchi or rales  Abd: soft, nontender, no hepatomegaly  Ext: no edema Musculoskeletal:  No deformities, BUE and BLE strength normal and equal Skin: warm and dry  Neuro:  CNs 2-12 intact, no focal abnormalities  noted Psych:  Normal affect    EKG:  The ECG that was done was personally reviewed and demonstrates sinus rhythm, widespread repolarization abnormalities, most prominently 1-2 mm ST horizontal ST segment depression in the inferior leads and V4-V6, looks prominent in 1 and aVL.   Laboratory Data:  Chemistry Recent Labs  Lab 08/16/17 1536  NA 139  K 3.7  CL 102  CO2 26  GLUCOSE 126*  BUN 20  CREATININE 1.03  CALCIUM 9.3  GFRNONAA >60  GFRAA >60  ANIONGAP 11    No results for input(s): PROT, ALBUMIN, AST, ALT, ALKPHOS, BILITOT in the last 168 hours. Hematology Recent Labs  Lab 08/16/17 1536  WBC 8.3  RBC 4.82  HGB 14.9  HCT 44.4  MCV 92.1  MCH 30.9  MCHC 33.6  RDW 13.8  PLT 181   Cardiac EnzymesNo results for input(s): TROPONINI in the last 168 hours.  Recent Labs  Lab 08/16/17 1552  TROPIPOC 0.37*    BNPNo results for input(s): BNP, PROBNP in the last 168 hours.  DDimer No results for input(s): DDIMER in the last 168 hours.  Radiology/Studies:  Dg Chest 2 View  Result Date: 08/16/2017 CLINICAL DATA:  Pt coming from urgent care with complaint of chest pain. He reports chest burning. Was sent by UC for further assessment. Pt states pain is worse with exertion. Non smoker. Hx htn. EXAM: CHEST  2 VIEW COMPARISON:  09/05/2008 chest CT FINDINGS: Heart size is mildly enlarged. No focal consolidations or pulmonary edema. There is mild costophrenic angle blunting on the right, consistent with pleural thickening or small pleural effusion. Mild midthoracic spondylosis. IMPRESSION: 1. Cardiomegaly without pulmonary edema. 2. Right costophrenic angle blunting, which may be chronic. Electronically Signed   By: Norva Pavlov M.D.   On: 08/16/2017 17:54    Assessment and Plan:   1. NSTEMI: Uncertain whether this is entirely demand ischemia due to severe hypertension.  High risk features include ECG changes and abnormal cardiac enzymes.  Suspect there is underlying coronary  artery disease and I am concerned that he has acute coronary insufficiency.  Will place on intravenous heparin and try to lower his blood pressure, preferentially using nitrates and beta-blockers.  Discussed the benefit of an early evaluation with an invasive procedure and plan cardiac catheterization, coronary angiography and possibly angioplasty/stent tomorrow.  Hopefully we will bring his blood pressure down to a more reasonable range before we perform the cardiac catheterization. This procedure has been fully reviewed with the patient and written informed consent has been obtained. 2. HTN: His blood pressure is severely elevated, which appears to be a relatively recent development.  This raises the possibility that he has renal artery stenosis.  Based on his old CAT scan is possible that he has bilateral renal artery stenosis.  We will therefore avoid the use of RAAS inhibitors until that is clarified.  Plan duplex ultrasonography of his renal arteries in AM.  If we can confirm the presence of elevated renal artery velocities on ultrasound, they might be the option to perform selective renal angiography at the time of his cardiac catheterization tomorrow. 3. HLP: Check a lipid profile.  Severity of Illness: The appropriate patient status for this patient is INPATIENT. Inpatient status is judged to be reasonable and necessary in order to provide the required intensity of service to ensure the patient's safety. The patient's presenting symptoms, physical exam findings, and initial radiographic and laboratory data in the context of their chronic comorbidities is felt to place them at high risk for further clinical deterioration. Furthermore, it is not anticipated that the patient will be medically stable for discharge from the hospital within 2 midnights of admission. The following factors support the patient status of inpatient.   " The patient's presenting symptoms include exertional chest pain in an  accelerating pattern, occasional chest pain at rest. " The worrisome physical exam findings include severe systolic and diastolic hypertension. " The initial radiographic and laboratory data are worrisome because of abnormal cardiac enzymes and ischemic ECG changes. " The chronic co-morbidities include severe hypertension.   * I certify that at the point of admission it is my clinical judgment that the patient will require inpatient hospital care spanning beyond 2 midnights from the point of admission due to high intensity of service, high  risk for further deterioration and high frequency of surveillance required.*    For questions or updates, please contact [facility] Please consult [email or web address] for contact info under Cardiology/STEMI.    Signed, Thurmon FairMihai Lutricia Widjaja, MD  08/16/2017 6:54 PM

## 2017-08-16 NOTE — ED Triage Notes (Addendum)
Pt coming from urgent care with complaint of chest pain. He reports chest burning. Was sent by UC for further assessment. Pt states pain is worse with exertion.

## 2017-08-16 NOTE — ED Notes (Signed)
Troponin = 0/37. Dr. Denton LankSteinl notified

## 2017-08-16 NOTE — ED Provider Notes (Signed)
[facility] Provider Note   CSN: [id number] Arrival date & time: 08/16/17  1518     History   Chief Complaint Chief Complaint  Patient presents with  . Chest Pain    HPI [name] is a 77 y.o. male past medical history hypertension who presents for evaluation of intermittent chest pain that is been ongoing for the last 5 days.  Patient states that for the last 5 days, he has been having some episodes of midsternal/left-sided chest pain.  He describes it as a burning sensation.  He states that it occasionally spreads to the left upper extremity.  He states that the pain was worsened with exertion.  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  He states that the pain was not worse with deep inspiration.  He did not get nauseous or diaphoretic with the pain.  Patient states that when he would sit and rest, the pain would subside by itself.  Patient states that he started having some of the same chest pain this morning.  He states that he took a Zantac to try and see if that would help but did not have any improvement in symptoms.  He went to urgent care and was sent to the ED for further evaluation.  On ED arrival, patient states that chest pain has resolved.  Patient reports a history of hypertension but does not have any diabetes or cardiac history.  He does state that his dad had a heart attack at age 79.  Patient states that he used to smoke cigarettes when he was younger but has not smoked in the last 4 decades.  Patient denies any fevers, difficulty breathing, abdominal pain, nausea/vomiting, numbness/weakness of his arms or legs.  The history is provided by the patient.    Past Medical History:  Diagnosis Date  . Hypertension     Patient Active Problem List   Diagnosis Date Noted  . NSTEMI (non-ST elevated myocardial infarction) (HCC) 08/16/2017    History  reviewed. No pertinent surgical history.     Home Medications    Prior to Admission medications   Medication Sig Start Date End Date Taking? Authorizing Provider  aspirin EC 81 MG tablet Take 162 mg by mouth daily.   Yes [provider]  Multiple Vitamins-Minerals (ONE-A-DAY 50 PLUS PO) Take 1 tablet by mouth daily.   Yes [provider]  Omega-3 Fatty Acids (ULTRA OMEGA-3 FISH OIL) 1400 MG CAPS Take 1 capsule by mouth daily.   Yes [provider]  ranitidine (ZANTAC) 75 MG tablet Take 75 mg by mouth daily as needed for heartburn.   Yes [provider]  traMADol (ULTRAM) 50 MG tablet Take 50 mg by mouth every 8 (eight) hours as needed (for pain).   Yes [provider]    Family History History reviewed. No pertinent family history.  Social History Social History   Tobacco Use  . Smoking status: Never Smoker  . Smokeless tobacco: Never Used  Substance Use Topics  . Alcohol use: No    Frequency: Never  . Drug use: Not on file     Allergies   Orange fruit [citrus]; Penicillins; and Wine [alcohol]   Review of Systems Review of Systems  Constitutional: Negative for chills and fever.  Eyes: Negative for visual disturbance.  Respiratory: Negative for cough and shortness of breath.  Cardiovascular: Positive for chest pain.  Gastrointestinal: Negative for abdominal pain, diarrhea, nausea and vomiting.  Genitourinary: Negative for dysuria and hematuria.  Musculoskeletal: Negative for back pain and neck pain.  Skin: Negative for rash.  Neurological: Negative for dizziness, weakness, numbness and headaches.  Psychiatric/Behavioral: Negative for confusion.  All other systems reviewed and are negative.    Physical Exam Updated Vital Signs BP 140/78 (BP Location: Left Arm)   Pulse 66   Temp 98.3 F (36.8 C) (Oral)   Resp 19   Ht 5\' 9"  (1.753 m)   Wt 71.8 kg (158 lb 6.4 oz)   SpO2 97%   BMI 23.39 kg/m   Physical Exam    Constitutional: He is oriented to person, place, and time. He appears well-developed and well-nourished.  Sitting comfortably on examination table  HENT:  Head: Normocephalic and atraumatic.  Mouth/Throat: Oropharynx is clear and moist and mucous membranes are normal.  Eyes: Conjunctivae, EOM and lids are normal. Pupils are equal, round, and reactive to light.  Neck: Full passive range of motion without pain.  Cardiovascular: Normal rate, regular rhythm, normal heart sounds and normal pulses. Exam reveals no gallop and no friction rub.  No murmur heard. Pulses:      Radial pulses are 2+ on the right side, and 2+ on the left side.       Dorsalis pedis pulses are 2+ on the right side, and 2+ on the left side.  Pulmonary/Chest: Effort normal and breath sounds normal.  No evidence of respiratory distress. Able to speak in full sentences without difficulty.  Abdominal: Soft. Normal appearance. There is no tenderness. There is no rigidity and no guarding.  Musculoskeletal: Normal range of motion.  Neurological: He is alert and oriented to person, place, and time.  Skin: Skin is warm and dry. Capillary refill takes less than 2 seconds.  Psychiatric: He has a normal mood and affect. His speech is normal.  Nursing note and vitals reviewed.    ED Treatments / Results  Labs (all labs ordered are listed, but only abnormal results are displayed) Labs Reviewed  BASIC METABOLIC PANEL - Abnormal; Notable for the following components:      Result Value   Glucose, Bld 126 (*)    All other components within normal limits  TROPONIN I - Abnormal; Notable for the following components:   Troponin I 1.58 (*)    All other components within normal limits  TROPONIN I - Abnormal; Notable for the following components:   Troponin I 1.51 (*)    All other components within normal limits  I-STAT TROPONIN, ED - Abnormal; Notable for the following components:   Troponin i, poc 0.37 (*)    All other components  within normal limits  I-STAT TROPONIN, ED - Abnormal; Notable for the following components:   Troponin i, poc 0.66 (*)    All other components within normal limits  MRSA PCR SCREENING  CBC  HEPARIN LEVEL (UNFRACTIONATED)  CBC  COMPREHENSIVE METABOLIC PANEL  TSH  TROPONIN I  PROTIME-INR  LIPID PANEL    EKG  EKG Interpretation  Date/Time:  Monday August 16 2017 17:48:23 EST Ventricular Rate:  79 PR Interval:  162 QRS Duration: 102 QT Interval:  410 QTC Calculation: 470 R Axis:   58 Text Interpretation:  Sinus rhythm Consider right atrial enlargement Repol abnrm, severe global ischemia (LM/MVD) Since last tracing of earlier today No significant change was found Confirmed by Mancel Bale [phone]) on 08/16/2017 5:56:18 PM  Radiology Dg Chest 2 View  Result Date: 08/16/2017 CLINICAL DATA:  Pt coming from urgent care with complaint of chest pain. He reports chest burning. Was sent by UC for further assessment. Pt states pain is worse with exertion. Non smoker. Hx htn. EXAM: CHEST  2 VIEW COMPARISON:  09/05/2008 chest CT FINDINGS: Heart size is mildly enlarged. No focal consolidations or pulmonary edema. There is mild costophrenic angle blunting on the right, consistent with pleural thickening or small pleural effusion. Mild midthoracic spondylosis. IMPRESSION: 1. Cardiomegaly without pulmonary edema. 2. Right costophrenic angle blunting, which may be chronic. Electronically Signed   By: Norva Pavlov M.D.   On: 08/16/2017 17:54    Procedures .Critical Care Performed by: Maxwell Caul, PA-C Authorized by: Maxwell Caul, PA-C   Critical care provider statement:    Critical care time (minutes):  30   Critical care was necessary to treat or prevent imminent or life-threatening deterioration of the following conditions:  Cardiac failure and circulatory failure   Critical care was time spent personally by me on the following activities:  Blood draw for specimens,  ordering and performing treatments and interventions, ordering and review of laboratory studies, discussions with consultants and re-evaluation of patient's condition   I assumed direction of critical care for this patient from another provider in my specialty: yes     (including critical care time)  Medications Ordered in ED Medications  heparin ADULT infusion 100 units/mL (25000 units/270mL sodium chloride 0.45%) (900 Units/hr Intravenous Transfusing/Transfer 08/16/17 2147)  aspirin EC tablet 81 mg (0 mg Oral Duplicate 08/17/17 1000)  nitroGLYCERIN (NITROSTAT) SL tablet 0.4 mg (not administered)  acetaminophen (TYLENOL) tablet 650 mg (not administered)  ondansetron (ZOFRAN) injection 4 mg (not administered)  0.9 %  sodium chloride infusion (not administered)  traMADol (ULTRAM) tablet 50 mg (not administered)  pantoprazole (PROTONIX) EC tablet 40 mg (not administered)  atorvastatin (LIPITOR) tablet 80 mg (not administered)  carvedilol (COREG) tablet 6.25 mg (6.25 mg Oral Given 08/16/17 1924)  hydrALAZINE (APRESOLINE) injection 10 mg (not administered)  0.9% sodium chloride infusion (1 mL/kg/hr  73.9 kg Intravenous New Bag/Given 08/16/17 2304)  amLODipine (NORVASC) tablet 5 mg (not administered)  aspirin chewable tablet 81 mg (not administered)  aspirin chewable tablet 324 mg (324 mg Oral Given 08/16/17 1816)  nitroGLYCERIN 50 mg in dextrose 5 % 250 mL (0.2 mg/mL) infusion (0 mcg/min Intravenous Stopped 08/16/17 2030)  heparin bolus via infusion 4,000 Units (4,000 Units Intravenous Bolus from Bag 08/16/17 1955)     Initial Impression / Assessment and Plan / ED Course  I have reviewed the triage vital signs and the nursing notes.  Pertinent labs & imaging results that were available during my care of the patient were reviewed by me and considered in my medical decision making (see chart for details).     77 y.o. M with PMH/o HTN who presents for evaluation of 5 days of chest pain.  Patient  reports that left-sided chest pain is worse with exertion and intimately would radiate to left upper extremity.  He states that if he stopped exerting himself and rested, he would have resolution of the chest pain.  Today he went to urgent care when the chest pain continued.  Patient is afebrile, non-toxic appearing, sitting comfortably on examination table. Vital signs reviewed. He is hypertensive. On ED arrival, patient does not have any chest pain.  He states he does have high blood pressure but states no cardiac history.  No diabetes.  He does not smoke. Consider unstable angina vs ACS etiology vs acute infectious etiology. History/physical exam are not concerning for a PE.  Initial labs, chest x-ray, EKG ordered at triage.  6:08 PM: I went to evaluate patient but he was not in the room. He had been taken to XR.   Labs and imaging reviewed.  I-STAT troponin is elevated.  CBC is without any abnormalities.  BMP is unremarkable.  EKG shows ST depressions in the inferior leads.  There is also some T wave inversions noted.  I did not see any previous EKGs for comparison.  Given concerns about elevated troponin, EKG findings will consult cardiology. Given elevations in troponin and EKG findings, will likely need admission for further cardiac workup and possible cath.   Discussed patient with Dr. Royann Conner (Cardiology). He will evaluate patient in the ED. Patient given ASA in the department.   Dr. Royann Conner (Cardiology) after evauation in the ED. He will plan to admit.     Final Clinical Impressions(s) / ED Diagnoses   Final diagnoses:  Chest pain, unspecified type  Elevated troponin    ED Discharge Orders    None       Maxwell Caul, PA-C 08/17/17 0231    Mancel Bale, MD 08/17/17 1156

## 2017-08-16 NOTE — ED Notes (Addendum)
ED MD informed of pt BP drop, fluid order given. Pt A&Ox4, pale and diaphoretic,. Cards paged

## 2017-08-16 NOTE — ED Provider Notes (Signed)
Face-to-face evaluation   History: He complains of an intermittent burning sensation in his anterior chest, bilateral, radiating to both upper arms, occurring both day and night, with both exertion and rest.  No accompanying shortness of breath, nausea, dizziness or weakness.  No prior similar problems.  He does not take blood pressure medications.  Physical exam: Elderly, slender, alert and cooperative.  He is calm.  Marked hypertension is present.  Heart regular rate and rhythm without murmur lungs clear anteriorly.  No dysarthria or aphasia.     EKG Interpretation  Date/Time:  Monday August 16 2017 15:29:46 EST Ventricular Rate:  86 PR Interval:  162 QRS Duration: 90 QT Interval:  360 QTC Calculation: 430 R Axis:   64 Text Interpretation:  Normal sinus rhythm with sinus arrhythmia Right atrial enlargement ST depression, consider subendocardial injury Abnormal ECG Since last tracing diffuse ST depression is more prominent Confirmed by Mancel BaleWentz, Burton Gahan [phone]([id number]) on 08/16/2017 5:53:56 PM       EKG Interpretation  Date/Time:  Monday August 16 2017 17:48:23 EST Ventricular Rate:  79 PR Interval:  162 QRS Duration: 102 QT Interval:  410 QTC Calculation: 470 R Axis:   58 Text Interpretation:  Sinus rhythm Consider right atrial enlargement Repol abnrm, severe global ischemia (LM/MVD) Since last tracing of earlier today No significant change was found Confirmed by Mancel BaleWentz, Aristides Luckey [phone]([id number]) on 08/16/2017 5:56:18 PM       EKG Interpretation  Date/Time:  Monday August 16 2017 17:55:08 EST Ventricular Rate:  89 PR Interval:  162 QRS Duration: 95 QT Interval:  345 QTC Calculation: 420 R Axis:   58 Text Interpretation:  Sinus rhythm Ventricular premature complex Consider right atrial enlargement Low voltage, precordial leads Consider anterior infarct Repol abnrm suggests ischemia, inferior leads Repeat EKG with Right-side chest leads; no ST elevation Confirmed by Mancel BaleWentz, Gaetana Kawahara [phone]([id number]) on  08/16/2017 6:09:04 PM       Patient Vitals for the past 24 hrs:  BP Temp Temp src Pulse Resp SpO2 Height Weight  08/17/17 1145 130/70 - - (!) 47 18 97 % - -  08/17/17 1130 124/76 - - (!) 50 19 95 % - -  08/17/17 1115 131/63 - - (!) 50 18 93 % - -  08/17/17 1105 123/65 98.1 F (36.7 C) Oral 60 - 92 % - -  08/17/17 1051 - - - (!) 0 (!) 0 (!) 0 % - -  08/17/17 1047 128/73 - - (!) 51 19 97 % - -  08/17/17 1042 139/80 - - (!) 50 12 94 % - -  08/17/17 1037 117/75 - - (!) 56 13 95 % - -  08/17/17 1032 131/77 - - (!) 57 15 97 % - -  08/17/17 1027 131/74 - - (!) 53 16 97 % - -  08/17/17 1022 114/64 - - 62 19 96 % - -  08/17/17 1017 134/74 - - 63 16 97 % - -  08/17/17 1012 139/81 - - (!) 56 19 98 % - -  08/17/17 1007 (!) 150/89 - - (!) 56 17 98 % - -  08/17/17 1002 (!) 153/83 - - 60 19 97 % - -  08/17/17 0958 (!) 145/82 - - (!) 58 15 98 % - -  08/17/17 0953 136/85 - - 65 13 (!) 86 % - -  08/17/17 0948 127/82 - - (!) 57 10 94 % - -  08/17/17 0943 131/81 - - 61 18 95 % - -  08/17/17 [id number] 129/77 - -  66 14 97 % - -  08/17/17 0933 140/82 - - 62 17 98 % - -  08/17/17 0928 133/79 - - 77 18 95 % - -  08/17/17 0923 129/77 - - 63 (!) 8 98 % - -  08/17/17 0918 119/76 - - 63 14 95 % - -  08/17/17 0913 127/75 - - 61 10 98 % - -  08/17/17 0909 125/75 - - (!) 59 13 98 % - -  08/17/17 0904 131/83 - - 65 17 98 % - -  08/17/17 0859 127/79 - - 60 17 97 % - -  08/17/17 0854 (!) 169/91 - - 68 20 99 % - -  08/17/17 0850 - - - - - 100 % - -  08/17/17 0849 (!) 155/93 - - 63 15 99 % - -  08/17/17 0848 - - - 68 - - - -  08/17/17 0817 (!) 160/90 - - 65 - - - -  08/17/17 0534 - - - - - - - 71.7 kg (158 lb 1.6 oz)  08/17/17 0530 134/83 98 F (36.7 C) Oral (!) 55 18 98 % - -  08/16/17 2200 140/78 98.3 F (36.8 C) Oral 66 19 97 % - -  08/16/17 2158 - - - - - - 5\' 9"  (1.753 m) 71.8 kg (158 lb 6.4 oz)  08/16/17 2145 128/76 - - (!) 55 (!) 22 99 % - -  08/16/17 2130 123/80 - - (!) 33 (!) 25 98 % - -  08/16/17  2125 126/79 - - 79 (!) 22 96 % - -  08/16/17 2120 120/79 - - - - 94 % - -  08/16/17 2115 106/67 - - 61 20 96 % - -  08/16/17 2110 96/67 - - (!) 56 19 96 % - -  08/16/17 2105 90/62 - - (!) 54 (!) 21 94 % - -  08/16/17 2030 95/72 - - 75 (!) 22 96 % - -  08/16/17 2015 108/76 - - 80 20 93 % - -  08/16/17 1945 (!) 170/88 - - 89 20 95 % - -  08/16/17 1849 - - - - - - 5\' 9"  (1.753 m) 73.9 kg (163 lb)  08/16/17 1845 (!) 199/123 - - 79 (!) 26 94 % - -  08/16/17 1800 (!) 191/128 - - 86 (!) 23 99 % 5\' 9"  (1.753 m) 73.9 kg (163 lb)  08/16/17 1730 (!) 180/108 - - 81 (!) 23 96 % - -  08/16/17 1700 (!) 179/106 - - 94 (!) 25 97 % - -  08/16/17 1646 (!) 194/116 - - 91 (!) 22 99 % - -  08/16/17 1530 (!) 214/115 98.9 F (37.2 C) Oral 90 (!) 22 99 % - -           .Critical Care Performed by: Mancel Bale, MD Authorized by: Mancel Bale, MD   Critical care provider statement:    Critical care time (minutes):  35   Critical care start time:  08/16/2017 5:50 PM   Critical care end time:  08/16/2017 6:25 PM   Critical care was necessary to treat or prevent imminent or life-threatening deterioration of the following conditions:  Cardiac failure and circulatory failure   Critical care was time spent personally by me on the following activities:  Blood draw for specimens, discussions with consultants, evaluation of patient's response to treatment, examination of patient, obtaining history from patient or surrogate, ordering and performing treatments and interventions, ordering and review of  laboratory studies, ordering and review of radiographic studies, pulse oximetry, re-evaluation of patient's condition and review of old charts       MDM: Chest pain with hypertension, and elevated troponin.  Evaluation consistent with ACS, complicated by hypertensive urgency, either primary or secondary.  Patient requires aggressive management and inpatient hospitalization with cardiac workup.    Medical  screening examination/treatment/procedure(s) were conducted as a shared visit with non-physician practitioner(s) and myself.  I personally evaluated the patient during the encounter    Mancel Bale, MD 08/17/17 1157

## 2017-08-16 NOTE — ED Notes (Signed)
Patient transported to X-ray 

## 2017-08-16 NOTE — ED Notes (Signed)
Pt's HR noted to be in the 40s and regular, repeat EKG done, given to EDP.

## 2017-08-16 NOTE — Progress Notes (Signed)
ANTICOAGULATION CONSULT NOTE - Initial Consult  Pharmacy Consult for heparin Indication: chest pain/ACS  Allergies  Allergen Reactions  . Orange Fruit [Citrus] Other (See Comments)    Reflux   . Penicillins Swelling    Welts all over also Has patient had a PCN reaction causing immediate rash, facial/tongue/throat swelling, SOB or lightheadedness with hypotension: Yes Has patient had a PCN reaction causing severe rash involving mucus membranes or skin necrosis: No Has patient had a PCN reaction that required hospitalization: No Has patient had a PCN reaction occurring within the last 10 years: No If all of the above answers are "NO", then may proceed with Cephalosporin use.   Hoyt Koch. Wine [Alcohol] Other (See Comments)    Reflux     Patient Measurements: Height: 5\' 9"  (175.3 cm) Weight: 163 lb (73.9 kg) IBW/kg (Calculated) : 70.7  Vital Signs: Temp: 98.9 F (37.2 C) (01/28 1530) Temp Source: Oral (01/28 1530) BP: 191/128 (01/28 1800) Pulse Rate: 86 (01/28 1800)  Labs: Recent Labs    08/16/17 1536  HGB 14.9  HCT 44.4  PLT 181  CREATININE 1.03    Estimated Creatinine Clearance: 59.1 mL/min (by C-G formula based on SCr of 1.03 mg/dL).   Medical History: Past Medical History:  Diagnosis Date  . Hypertension     Medications:  See electronic medication list  Assessment: 77 y/o male who presented from an urgent care with complaint of chest pain and sent to the ED. Pharmacy consulted to begin IV heparin. CBC is normal. Troponin is elevated at 0.37. Renal function is normal.  Goal of Therapy:  Heparin level 0.3-0.7 units/ml Monitor platelets by anticoagulation protocol: Yes   Plan:  Heparin 4000 units IV bolus then infuse at 900 units/hr 8 hr heparin level Daily heparin level and CBC Monitor for s/sx of bleeding   Loura BackJennifer St. Georges, PharmD, BCPS Clinical Pharmacist Phone for today [phone] Main pharmacy - [phone] 08/16/2017 6:50 PM

## 2017-08-16 NOTE — Progress Notes (Signed)
Cardiology Admission History and Physical:   Patient ID: CAROLYN MANISCALCO; MRN: [id number]; DOB: 03-13-1941   Admission date: 08/16/2017  Primary Care Provider: Patient, No Pcp Per Primary Cardiologist: No primary care provider on file. New Primary Electrophysiologist:  n/a  Chief Complaint:  Unstable angina  Patient Profile:   OSVALDO LAMPING is a 77 y.o. male with a history of "mild" hypertension who presents with a couple of weeks of exertional chest discomfort.  He has repolarization of normality's on his ECG and mildly abnormal cardiac troponin I.  History of Present Illness:   [name] has a history of mildly elevated high blood pressure, but has never taken medications for it.  He denies any other serious medical problems.  Over the last couple of weeks he has had intermittent exertion related retrosternal burning radiating to his arms.  It has rarely also occurred at rest, but usually happens when he is working in the yard or feeding his animals.  The pattern does appear to be accelerating.  He denies dyspnea, palpitations, dizziness or syncope, headache, visual changes or focal neurological complaints, leg edema, claudication or abdominal pain.  Found to be severely hypertensive with blood pressure hovering around 220/120 mmHg.  However he is currently asymptomatic and appears quite comfortable.  His electrocardiogram shows widely diffuse ST segment depression and T wave inversion in almost all leads.  His cardiac troponin I was 0.37.  He has normal potassium level and normal renal parameters.  His chest x-ray shows mild cardiomegaly and blunting of the right costophrenic angle which is a chronic abnormality.  The chest CT performed in 2010 showed scattered coronary calcifications as well as atherosclerotic calcification at the ostia of both renal arteries.  He smoked briefly when in the [employer] in the early 1960s.  He does not have a strong family history of premature coronary  vascular disease.  He does not have diabetes mellitus.  He takes omega-3 fatty acid supplements to help his lipid profile, but we do not have details regarding his cholesterol.   Past Medical History:  Diagnosis Date  . Hypertension     History reviewed. No pertinent surgical history.   Medications Prior to Admission: Prior to Admission medications   Medication Sig Start Date End Date Taking? Authorizing Provider  aspirin EC 81 MG tablet Take 162 mg by mouth daily.   Yes [provider]  Multiple Vitamins-Minerals (ONE-A-DAY 50 PLUS PO) Take 1 tablet by mouth daily.   Yes [provider]  Omega-3 Fatty Acids (ULTRA OMEGA-3 FISH OIL) 1400 MG CAPS Take 1 capsule by mouth daily.   Yes [provider]  ranitidine (ZANTAC) 75 MG tablet Take 75 mg by mouth daily as needed for heartburn.   Yes [provider]  traMADol (ULTRAM) 50 MG tablet Take 50 mg by mouth every 8 (eight) hours as needed (for pain).   Yes [provider]     Allergies:    Allergies  Allergen Reactions  . Orange Fruit [Citrus] Other (See Comments)    Reflux   . Penicillins Swelling    Welts all over also Has patient had a PCN reaction causing immediate rash, facial/tongue/throat swelling, SOB or lightheadedness with hypotension: Yes Has patient had a PCN reaction causing severe rash involving mucus membranes or skin necrosis: No Has patient had a PCN reaction that required hospitalization: No Has patient had a PCN reaction occurring within the last 10 years: No If all of the above answers are "NO", then  may proceed with Cephalosporin use.   Hoyt Koch [Alcohol] Other (See Comments)    Reflux     Social History:   Social History   Socioeconomic History  . Marital status: Single    Spouse name: Not on file  . Number of children: Not on file  . Years of education: Not on file  . Highest education level: Not on file  Social Needs  . Financial resource strain: Not on  file  . Food insecurity - worry: Not on file  . Food insecurity - inability: Not on file  . Transportation needs - medical: Not on file  . Transportation needs - non-medical: Not on file  Occupational History  . Not on file  Tobacco Use  . Smoking status: Never Smoker  . Smokeless tobacco: Never Used  Substance and Sexual Activity  . Alcohol use: No    Frequency: Never  . Drug use: Not on file  . Sexual activity: No  Other Topics Concern  . Not on file  Social History Narrative  . Not on file    Family History:   The patient's family history significantly negative for premature coronary artery vascular problems  ROS:  Please see the history of present illness.  All other ROS reviewed and negative.     Physical Exam/Data:   Vitals:   08/16/17 1700 08/16/17 1730 08/16/17 1800 08/16/17 1849  BP: (!) 179/106 (!) 180/108 (!) 191/128   Pulse: 94 81 86   Resp: (!) 25 (!) 23 (!) 23   Temp:      TempSrc:      SpO2: 97% 96% 99%   Weight:   163 lb (73.9 kg) 163 lb (73.9 kg)  Height:   5\' 9"  (1.753 m) 5\' 9"  (1.753 m)   No intake or output data in the 24 hours ending 08/16/17 1854 Filed Weights   08/16/17 1800 08/16/17 1849  Weight: 163 lb (73.9 kg) 163 lb (73.9 kg)   Body mass index is 24.07 kg/m.  General:  Well nourished, well developed, in no acute distress; he looks remarkably fit and wiry for his age HEENT: normal Lymph: no adenopathy Neck: no JVD Endocrine:  No thryomegaly Vascular: No carotid bruits; FA pulses 2+ bilaterally without bruits ; he does not have any abdominal bruits; he has 2+ pulses in the dorsalis pedis and posterior tibials bilaterally. Cardiac:  normal S1, S2; RRR; no murmur  Lungs:  clear to auscultation bilaterally, no wheezing, rhonchi or rales  Abd: soft, nontender, no hepatomegaly  Ext: no edema Musculoskeletal:  No deformities, BUE and BLE strength normal and equal Skin: warm and dry  Neuro:  CNs 2-12 intact, no focal abnormalities  noted Psych:  Normal affect    EKG:  The ECG that was done was personally reviewed and demonstrates sinus rhythm, widespread repolarization abnormalities, most prominently 1-2 mm ST horizontal ST segment depression in the inferior leads and V4-V6, looks prominent in 1 and aVL.   Laboratory Data:  Chemistry Recent Labs  Lab 08/16/17 1536  NA 139  K 3.7  CL 102  CO2 26  GLUCOSE 126*  BUN 20  CREATININE 1.03  CALCIUM 9.3  GFRNONAA >60  GFRAA >60  ANIONGAP 11    No results for input(s): PROT, ALBUMIN, AST, ALT, ALKPHOS, BILITOT in the last 168 hours. Hematology Recent Labs  Lab 08/16/17 1536  WBC 8.3  RBC 4.82  HGB 14.9  HCT 44.4  MCV 92.1  MCH 30.9  MCHC 33.6  RDW 13.8  PLT 181   Cardiac EnzymesNo results for input(s): TROPONINI in the last 168 hours.  Recent Labs  Lab 08/16/17 1552  TROPIPOC 0.37*    BNPNo results for input(s): BNP, PROBNP in the last 168 hours.  DDimer No results for input(s): DDIMER in the last 168 hours.  Radiology/Studies:  Dg Chest 2 View  Result Date: 08/16/2017 CLINICAL DATA:  Pt coming from urgent care with complaint of chest pain. He reports chest burning. Was sent by UC for further assessment. Pt states pain is worse with exertion. Non smoker. Hx htn. EXAM: CHEST  2 VIEW COMPARISON:  09/05/2008 chest CT FINDINGS: Heart size is mildly enlarged. No focal consolidations or pulmonary edema. There is mild costophrenic angle blunting on the right, consistent with pleural thickening or small pleural effusion. Mild midthoracic spondylosis. IMPRESSION: 1. Cardiomegaly without pulmonary edema. 2. Right costophrenic angle blunting, which may be chronic. Electronically Signed   By: Norva Pavlov M.D.   On: 08/16/2017 17:54    Assessment and Plan:   1. NSTEMI: Uncertain whether this is entirely demand ischemia due to severe hypertension.  High risk features include ECG changes and abnormal cardiac enzymes.  Suspect there is underlying coronary  artery disease and I am concerned that he has acute coronary insufficiency.  Will place on intravenous heparin and try to lower his blood pressure, preferentially using nitrates and beta-blockers.  Discussed the benefit of an early evaluation with an invasive procedure and plan cardiac catheterization, coronary angiography and possibly angioplasty/stent tomorrow.  Hopefully we will bring his blood pressure down to a more reasonable range before we perform the cardiac catheterization. This procedure has been fully reviewed with the patient and written informed consent has been obtained. 2. HTN: His blood pressure is severely elevated, which appears to be a relatively recent development.  This raises the possibility that he has renal artery stenosis.  Based on his old CAT scan is possible that he has bilateral renal artery stenosis.  We will therefore avoid the use of RAAS inhibitors until that is clarified.  Plan duplex ultrasonography of his renal arteries in AM.  If we can confirm the presence of elevated renal artery velocities on ultrasound, they might be the option to perform selective renal angiography at the time of his cardiac catheterization tomorrow. 3. HLP: Check a lipid profile.  Severity of Illness: The appropriate patient status for this patient is INPATIENT. Inpatient status is judged to be reasonable and necessary in order to provide the required intensity of service to ensure the patient's safety. The patient's presenting symptoms, physical exam findings, and initial radiographic and laboratory data in the context of their chronic comorbidities is felt to place them at high risk for further clinical deterioration. Furthermore, it is not anticipated that the patient will be medically stable for discharge from the hospital within 2 midnights of admission. The following factors support the patient status of inpatient.   " The patient's presenting symptoms include exertional chest pain in an  accelerating pattern, occasional chest pain at rest. " The worrisome physical exam findings include severe systolic and diastolic hypertension. " The initial radiographic and laboratory data are worrisome because of abnormal cardiac enzymes and ischemic ECG changes. " The chronic co-morbidities include severe hypertension.   * I certify that at the point of admission it is my clinical judgment that the patient will require inpatient hospital care spanning beyond 2 midnights from the point of admission due to high intensity of service, high  risk for further deterioration and high frequency of surveillance required.*    For questions or updates, please contact [facility] Please consult [email or web address] for contact info under Cardiology/STEMI.    Signed, Thurmon FairMihai Coty Larsh, MD  08/16/2017 6:54 PM

## 2017-08-17 ENCOUNTER — Encounter (HOSPITAL_COMMUNITY)
Admission: EM | Disposition: A | Discharge status: Home / Self Care | Payer: Self-pay | Source: Home / Self Care | Attending: Cardiovascular Disease

## 2017-08-17 ENCOUNTER — Encounter (HOSPITAL_COMMUNITY): Payer: Self-pay | Admitting: Cardiology

## 2017-08-17 DIAGNOSIS — I10 Essential (primary) hypertension: Secondary | ICD-10-CM

## 2017-08-17 DIAGNOSIS — I2510 Atherosclerotic heart disease of native coronary artery without angina pectoris: Secondary | ICD-10-CM

## 2017-08-17 HISTORY — PX: CORONARY STENT INTERVENTION: CATH118234

## 2017-08-17 HISTORY — PX: LEFT HEART CATH AND CORONARY ANGIOGRAPHY: CATH118249

## 2017-08-17 HISTORY — PX: CORONARY BALLOON ANGIOPLASTY: CATH118233

## 2017-08-17 LAB — CBC
HCT: 38 % — ABNORMAL LOW (ref 39.0–52.0)
Hemoglobin: 12.7 g/dL — ABNORMAL LOW (ref 13.0–17.0)
MCH: 30.8 pg (ref 26.0–34.0)
MCHC: 33.4 g/dL (ref 30.0–36.0)
MCV: 92.2 fL (ref 78.0–100.0)
Platelets: 151 10*3/uL (ref 150–400)
RBC: 4.12 MIL/uL — ABNORMAL LOW (ref 4.22–5.81)
RDW: 13.7 % (ref 11.5–15.5)
WBC: 8.2 10*3/uL (ref 4.0–10.5)

## 2017-08-17 LAB — COMPREHENSIVE METABOLIC PANEL
ALT: 26 U/L (ref 17–63)
AST: 36 U/L (ref 15–41)
Albumin: 3.2 g/dL — ABNORMAL LOW (ref 3.5–5.0)
Alkaline Phosphatase: 47 U/L (ref 38–126)
Anion gap: 8 (ref 5–15)
BUN: 21 mg/dL — ABNORMAL HIGH (ref 6–20)
CO2: 26 mmol/L (ref 22–32)
Calcium: 8.6 mg/dL — ABNORMAL LOW (ref 8.9–10.3)
Chloride: 108 mmol/L (ref 101–111)
Creatinine, Ser: 1.03 mg/dL (ref 0.61–1.24)
GFR calc Af Amer: >60 mL/min (ref >60)
GFR calc non Af Amer: >60 mL/min (ref >60)
Glucose, Bld: 109 mg/dL — ABNORMAL HIGH (ref 65–99)
Potassium: 4.3 mmol/L (ref 3.5–5.1)
Sodium: 142 mmol/L (ref 135–145)
Total Bilirubin: 0.7 mg/dL (ref 0.3–1.2)
Total Protein: 6 g/dL — ABNORMAL LOW (ref 6.5–8.1)

## 2017-08-17 LAB — MRSA PCR SCREENING: MRSA BY PCR: NEGATIVE

## 2017-08-17 LAB — LIPID PANEL
Cholesterol: 167 mg/dL (ref 0–200)
HDL: 48 mg/dL (ref >40)
LDL Cholesterol: 111 mg/dL — ABNORMAL HIGH (ref 0–99)
Total CHOL/HDL Ratio: 3.5 RATIO
Triglycerides: 39 mg/dL (ref <150)
VLDL: 8 mg/dL (ref 0–40)

## 2017-08-17 LAB — POCT ACTIVATED CLOTTING TIME
ACTIVATED CLOTTING TIME: 219 s
Activated Clotting Time: 290 seconds
Activated Clotting Time: 279 seconds

## 2017-08-17 LAB — TROPONIN I
Troponin I: 1.51 ng/mL (ref <0.03)
Troponin I: 1.54 ng/mL (ref <0.03)

## 2017-08-17 LAB — HEPARIN LEVEL (UNFRACTIONATED): Heparin Unfractionated: 0.3 [IU]/mL (ref 0.30–0.70)

## 2017-08-17 LAB — PROTIME-INR
INR: 1.19
Prothrombin Time: 15 seconds (ref 11.4–15.2)

## 2017-08-17 LAB — TSH: TSH: 1.804 u[IU]/mL (ref 0.350–4.500)

## 2017-08-17 SURGERY — LEFT HEART CATH AND CORONARY ANGIOGRAPHY
Anesthesia: LOCAL

## 2017-08-17 MED ORDER — FENTANYL CITRATE (PF) 100 MCG/2ML IJ SOLN
INTRAMUSCULAR | Status: DC | PRN
  Administered 2017-08-17: 25 ug via INTRAVENOUS

## 2017-08-17 MED ORDER — NITROGLYCERIN 0.4 MG SL SUBL
SUBLINGUAL_TABLET | SUBLINGUAL | Status: AC
  Filled 2017-08-17: qty 1

## 2017-08-17 MED ORDER — SODIUM CHLORIDE 0.9 % IV SOLN: 250.0000 mL | INTRAVENOUS | Status: DC | PRN

## 2017-08-17 MED ORDER — TICAGRELOR 90 MG PO TABS
ORAL_TABLET | ORAL | Status: AC
  Filled 2017-08-17: qty 2

## 2017-08-17 MED ORDER — VERAPAMIL HCL 2.5 MG/ML IV SOLN
INTRAVENOUS | Status: AC
  Filled 2017-08-17: qty 2

## 2017-08-17 MED ORDER — VERAPAMIL HCL 2.5 MG/ML IV SOLN
INTRAVENOUS | Status: DC | PRN
  Administered 2017-08-17: 10 mL via INTRA_ARTERIAL

## 2017-08-17 MED ORDER — IOPAMIDOL (ISOVUE-370) INJECTION 76%
INTRAVENOUS | Status: AC
  Filled 2017-08-17: qty 125

## 2017-08-17 MED ORDER — SODIUM CHLORIDE 0.9% FLUSH: 3.0000 mL | INTRAVENOUS | Status: DC | PRN

## 2017-08-17 MED ORDER — MIDAZOLAM HCL 2 MG/2ML IJ SOLN
INTRAMUSCULAR | Status: DC | PRN
  Administered 2017-08-17: 2 mg via INTRAVENOUS
  Administered 2017-08-17: 1 mg via INTRAVENOUS

## 2017-08-17 MED ORDER — LABETALOL HCL 5 MG/ML IV SOLN: 10.0000 mg | INTRAVENOUS | Status: AC | PRN

## 2017-08-17 MED ORDER — MIDAZOLAM HCL 2 MG/2ML IJ SOLN
INTRAMUSCULAR | Status: AC
  Filled 2017-08-17: qty 2

## 2017-08-17 MED ORDER — SODIUM CHLORIDE 0.9 % IV SOLN: INTRAVENOUS | Status: AC

## 2017-08-17 MED ORDER — IOPAMIDOL (ISOVUE-370) INJECTION 76%
INTRAVENOUS | Status: DC | PRN
  Administered 2017-08-17: 225 mL via INTRA_ARTERIAL

## 2017-08-17 MED ORDER — HEART ATTACK BOUNCING BOOK
Freq: Once | Status: AC
  Administered 2017-08-17: 21:00:00 1
  Filled 2017-08-17: qty 1

## 2017-08-17 MED ORDER — TICAGRELOR 90 MG PO TABS
ORAL_TABLET | ORAL | Status: DC | PRN
  Administered 2017-08-17: 180 mg via ORAL

## 2017-08-17 MED ORDER — HEPARIN SODIUM (PORCINE) 1000 UNIT/ML IJ SOLN
INTRAMUSCULAR | Status: DC | PRN
  Administered 2017-08-17: 3000 [IU] via INTRAVENOUS
  Administered 2017-08-17 (×2): 4000 [IU] via INTRAVENOUS

## 2017-08-17 MED ORDER — NITROGLYCERIN 0.4 MG SL SUBL
SUBLINGUAL_TABLET | SUBLINGUAL | Status: DC | PRN
  Administered 2017-08-17: .4 mg via SUBLINGUAL

## 2017-08-17 MED ORDER — LIDOCAINE HCL (PF) 1 % IJ SOLN
INTRAMUSCULAR | Status: AC
  Filled 2017-08-17: qty 30

## 2017-08-17 MED ORDER — LIDOCAINE HCL (PF) 1 % IJ SOLN
INTRAMUSCULAR | Status: DC | PRN
  Administered 2017-08-17: 2 mL

## 2017-08-17 MED ORDER — NITROGLYCERIN 1 MG/10 ML FOR IR/CATH LAB
INTRA_ARTERIAL | Status: DC | PRN
  Administered 2017-08-17 (×2): 200 ug via INTRACORONARY

## 2017-08-17 MED ORDER — FENTANYL CITRATE (PF) 100 MCG/2ML IJ SOLN
INTRAMUSCULAR | Status: AC
  Filled 2017-08-17: qty 2

## 2017-08-17 MED ORDER — ANGIOPLASTY BOOK
Freq: Once | Status: AC
  Administered 2017-08-17: 1
  Filled 2017-08-17: qty 1

## 2017-08-17 MED ORDER — HEPARIN (PORCINE) IN NACL 2-0.9 UNIT/ML-% IJ SOLN
INTRAMUSCULAR | Status: DC | PRN
  Administered 2017-08-17: 1000 mL

## 2017-08-17 MED ORDER — FENTANYL CITRATE (PF) 100 MCG/2ML IJ SOLN
INTRAMUSCULAR | Status: DC | PRN
  Administered 2017-08-17: 25 ug via INTRAVENOUS
  Administered 2017-08-17: 50 ug via INTRAVENOUS

## 2017-08-17 MED ORDER — NITROGLYCERIN 1 MG/10 ML FOR IR/CATH LAB
INTRA_ARTERIAL | Status: AC
  Filled 2017-08-17: qty 10

## 2017-08-17 MED ORDER — HEPARIN (PORCINE) IN NACL 2-0.9 UNIT/ML-% IJ SOLN
INTRAMUSCULAR | Status: AC
  Filled 2017-08-17: qty 1000

## 2017-08-17 MED ORDER — SODIUM CHLORIDE 0.9% FLUSH
3.0000 mL | Freq: Two times a day (BID) | INTRAVENOUS | Status: DC
  Administered 2017-08-18: 09:00:00 3 mL via INTRAVENOUS

## 2017-08-17 MED ORDER — HEPARIN SODIUM (PORCINE) 1000 UNIT/ML IJ SOLN
INTRAMUSCULAR | Status: AC
  Filled 2017-08-17: qty 1

## 2017-08-17 MED ORDER — TICAGRELOR 90 MG PO TABS
90.0000 mg | ORAL_TABLET | Freq: Two times a day (BID) | ORAL | Status: DC
  Administered 2017-08-17 – 2017-08-18 (×2): 90 mg via ORAL
  Filled 2017-08-17 (×2): qty 1

## 2017-08-17 MED ORDER — MIDAZOLAM HCL 2 MG/2ML IJ SOLN
INTRAMUSCULAR | Status: DC | PRN
  Administered 2017-08-17: 1 mg via INTRAVENOUS

## 2017-08-17 SURGICAL SUPPLY — 30 items
BALLN EUPHORA RX 2.25X6 (BALLOONS) ×2
BALLN SAPPHIRE 2.0X20 (BALLOONS) ×2
BALLN WOLVERINE 2.25X10 (BALLOONS) ×2
BALLN WOLVERINE 2.50X10 (BALLOONS)
BALLN ~~LOC~~ EUPHORA RX 3.25X12 (BALLOONS) ×2
BALLOON EUPHORA RX 2.25X6 (BALLOONS) IMPLANT
BALLOON SAPPHIRE 2.0X20 (BALLOONS) IMPLANT
BALLOON WOLVERINE 2.25X10 (BALLOONS) IMPLANT
BALLOON WOLVERINE 2.50X10 (BALLOONS) IMPLANT
BALLOON ~~LOC~~ EUPHORA RX 3.25X12 (BALLOONS) IMPLANT
CATH INFINITI 5 FR JL3.5 (CATHETERS) ×1 IMPLANT
CATH INFINITI 5FR ANG PIGTAIL (CATHETERS) ×1 IMPLANT
CATH OPTITORQUE TIG 4.0 5F (CATHETERS) ×1 IMPLANT
CATH VISTA GUIDE 6FR XBLAD3.5 (CATHETERS) ×1 IMPLANT
DEVICE RAD COMP TR BAND LRG (VASCULAR PRODUCTS) ×1 IMPLANT
GLIDESHEATH SLEND SS 6F .021 (SHEATH) ×1 IMPLANT
GUIDEWIRE INQWIRE 1.5J.035X260 (WIRE) IMPLANT
INQWIRE 1.5J .035X260CM (WIRE) ×2
KIT ENCORE 26 ADVANTAGE (KITS) ×1 IMPLANT
KIT ESSENTIALS PG (KITS) ×1 IMPLANT
KIT HEART LEFT (KITS) ×2 IMPLANT
KIT HEMO VALVE WATCHDOG (MISCELLANEOUS) ×1 IMPLANT
PACK CARDIAC CATHETERIZATION (CUSTOM PROCEDURE TRAY) ×2 IMPLANT
STENT SYNERGY DES 2.25X32 (Permanent Stent) ×1 IMPLANT
STENT SYNERGY DES 2.5X12 (Permanent Stent) ×1 IMPLANT
STENT SYNERGY DES 2.75X16 (Permanent Stent) ×1 IMPLANT
TRANSDUCER W/STOPCOCK (MISCELLANEOUS) ×2 IMPLANT
TUBING CIL FLEX 10 FLL-RA (TUBING) ×2 IMPLANT
WIRE ASAHI PROWATER 180CM (WIRE) ×1 IMPLANT
WIRE MARVEL STR TIP 190CM (WIRE) ×1 IMPLANT

## 2017-08-17 NOTE — Discharge Summary (Signed)
Discharge Summary    Patient ID: [name],  MRN: [id number], DOB/AGE: March 25, 1941 77 y.o.  Admit date: 08/16/2017 Discharge date: 08/18/2017  Primary Care Provider: [name] Primary Cardiologist: Croitoru  Discharge Diagnoses    Active Problems:   NSTEMI (non-ST elevated myocardial infarction) [facility])   Accelerated hypertension   Hyperlipidemia   Allergies Allergies  Allergen Reactions  . Orange Fruit [Citrus] Other (See Comments)    Reflux   . Penicillins Swelling    Welts all over also Has patient had a PCN reaction causing immediate rash, facial/tongue/throat swelling, SOB or lightheadedness with hypotension: Yes Has patient had a PCN reaction causing severe rash involving mucus membranes or skin necrosis: No Has patient had a PCN reaction that required hospitalization: No Has patient had a PCN reaction occurring within the last 10 years: No If all of the above answers are "NO", then may proceed with Cephalosporin use.   . Wine [Alcohol] Other (See Comments)    Reflux     Diagnostic Studies/Procedures    Cath: 08/17/17  Conclusion     There is mild left ventricular systolic dysfunction. The left ventricular ejection fraction is 45-50% by visual estimate.  LV end diastolic pressure is normal.  -------------------------------------------------  Mid Cx lesion is 99% stenosed.  A drug-eluting stent was successfully placed using a STENT SYNERGY DES 2.5X12.  Post intervention, there is a 0% residual stenosis.  _____________________________________________________________________  Mid LAD-1 lesion is 95% stenosed. Mid LAD-2 lesion is 90% stenosed. Mid LAD to Dist LAD lesion is 80% stenosed.  2 Overlapping drug-eluting stents were successfully placed using a STENT SYNERGY DES 2.25X32 (2.4 mm) & STENT SYNERGY DES 2.75X16. (3.25 mm)  Post intervention, there is a 0% residual  stenosis.  _____________________________________________________________________  Suezanne Jacquet 2nd Diag lesion is 99% stenosed. (Bifurcation lesion - planned PTCA only)  Balloon angioplasty was performed using a BALLOON WOLVERINE 2.50X10. Post intervention, there is a 50% residual stenosis.   Successful multisite PCI of the circumflex and LAD with PTCA of the D2. Mildly reduced EF with inferolateral hypokinesis.  Plan:  Return to 6 Central post procedure unit for post PCI management.  TR band we will per protocol.  DAPT for minimum 1 year -would continue Thienopyridine beyond  Continue aggressive risk factor modification with lipid and blood pressure management.  Anticipate that he should be ready for discharge tomorrow   [name], M.D., M.S. Interventional Cardiologist   _____________   History of Present Illness     [name] has a history of mildly elevated high blood pressure, but has never taken medications for it.  He denied any other serious medical problems.  Over the last couple of weeks he had intermittent exertion related retrosternal burning radiating to his arms.  It has rarely also occurred at rest, but usually happens when he was working in the yard or feeding his animals.  The pattern did appear to be accelerating.  He denied dyspnea, palpitations, dizziness or syncope, headache, visual changes or focal neurological complaints, leg edema, claudication or abdominal pain.  Found to be severely hypertensive with blood pressure hovering around 220/120 mmHg.  However he was currently asymptomatic and appearred quite comfortable.  His electrocardiogram showed widely diffuse ST segment depression and T wave inversion in almost all leads.  His cardiac troponin I was 0.37.  He had a normal potassium level and normal renal parameters. His chest x-ray showed mild cardiomegaly and blunting of the right costophrenic angle which is a chronic  abnormality.  The chest CT  performed in 2010 showed scattered coronary calcifications as well as atherosclerotic calcification at the ostia of both renal arteries.  He smoked briefly when in the [employer] in the early 1960s.  He did not have a strong family history of premature coronary vascular disease.  He did not report any hx of diabetes mellitus. Given his symptoms he was admitted with plans for cardiac cath.    Hospital Course     He was placed on IV heparin. Underwent cardiac cath with PCI/DES x1 to mLcx and PCI/DES x2 to m-dLAD. Plan for DAPT with ASA/Brilinta. LV gram noted EF of 45-50%. Troponin peaked at 1.58. LDL was 111, therefore was started on high dose statin therapy. Blood pressures improved with the addition of coreg and amlodipine. Post cath labs showed stable Cr and Hgb. There was concern for renal artery stenosis based off his CAT scan. Will need to consider doing outpatient renal dopplers. No recurrent chest pain. Worked well with cardiac rehab.   [name] PlanMichael J Conner was seen by Dr. Clifton JamesMcAlhany and determined stable for discharge home. Follow up in the office has been arranged. Medications are listed below.   _____________  Discharge Vitals Blood pressure (!) 163/98, pulse 74, temperature 98.2 F (36.8 C), temperature source Oral, resp. rate 20, height 5\' 9"  (1.753 m), weight 158 lb (71.7 kg), SpO2 95 %.  Filed Weights   08/16/17 2158 08/17/17 0534 08/18/17 0545  Weight: 158 lb 6.4 oz (71.8 kg) 158 lb 1.6 oz (71.7 kg) 158 lb (71.7 kg)    Labs & Radiologic Studies    CBC Recent Labs    08/17/17 0407 08/18/17 0419  WBC 8.2 9.3  HGB 12.7* 13.0  HCT 38.0* 38.8*  MCV 92.2 92.4  PLT 151 139*   Basic Metabolic Panel Recent Labs    16/04/9600/29/19 0407 08/18/17 0419  NA 142 139  K 4.3 3.5  CL 108 105  CO2 26 24  GLUCOSE 109* 99  BUN 21* 15  CREATININE 1.03 0.97  CALCIUM 8.6* 8.5*   Liver Function Tests Recent Labs    08/17/17 0407  AST 36  ALT 26  ALKPHOS 47  BILITOT 0.7  PROT 6.0*   ALBUMIN 3.2*   No results for input(s): LIPASE, AMYLASE in the last 72 hours. Cardiac Enzymes Recent Labs    08/16/17 2120 08/17/17 0032 08/17/17 0733  TROPONINI 1.58* 1.51* 1.54*   BNP Invalid input(s): POCBNP D-Dimer No results for input(s): DDIMER in the last 72 hours. Hemoglobin A1C No results for input(s): HGBA1C in the last 72 hours. Fasting Lipid Panel Recent Labs    08/17/17 0407  CHOL 167  HDL 48  LDLCALC 111*  TRIG 39  CHOLHDL 3.5   Thyroid Function Tests Recent Labs    08/17/17 0407  TSH 1.804   _____________  Dg Chest 2 View  Result Date: 08/16/2017 CLINICAL DATA:  Pt coming from urgent care with complaint of chest pain. He reports chest burning. Was sent by UC for further assessment. Pt states pain is worse with exertion. Non smoker. Hx htn. EXAM: CHEST  2 VIEW COMPARISON:  09/05/2008 chest CT FINDINGS: Heart size is mildly enlarged. No focal consolidations or pulmonary edema. There is mild costophrenic angle blunting on the right, consistent with pleural thickening or small pleural effusion. Mild midthoracic spondylosis. IMPRESSION: 1. Cardiomegaly without pulmonary edema. 2. Right costophrenic angle blunting, which may be chronic. Electronically Signed   By: Norva PavlovElizabeth  Brown M.D.   On: 08/16/2017  17:54   Disposition   Pt is being discharged home today in good condition.  Follow-up Plans & Appointments    Follow-up Information    Barrett, Joline Salt, PA-C Follow up on 08/31/2017.   Specialties:  Cardiology, Radiology Why:  at 8:45am for your follow up appt.  Contact information: [address] [place] [address] [phone]          Discharge Instructions    AMB Referral to Cardiac Rehabilitation - Phase II   Complete by:  As directed    Diagnosis:   Coronary Stents NSTEMI     Call MD for:  redness, tenderness, or signs of infection (pain, swelling, redness, odor or green/yellow discharge around incision site)   Complete by:   As directed    Diet - low sodium heart healthy   Complete by:  As directed    Discharge instructions   Complete by:  As directed    Radial Site Care Refer to this sheet in the next few weeks. These instructions provide you with information on caring for yourself after your procedure. Your caregiver may also give you more specific instructions. Your treatment has been planned according to current medical practices, but problems sometimes occur. Call your caregiver if you have any problems or questions after your procedure. HOME CARE INSTRUCTIONS You may shower the day after the procedure.Remove the bandage (dressing) and gently wash the site with plain soap and water.Gently pat the site dry.  Do not apply powder or lotion to the site.  Do not submerge the affected site in water for 3 to 5 days.  Inspect the site at least twice daily.  Do not flex or bend the affected arm for 24 hours.  No lifting over 5 pounds (2.3 kg) for 5 days after your procedure.  Do not drive home if you are discharged the same day of the procedure. Have someone else drive you.  You may drive 24 hours after the procedure unless otherwise instructed by your caregiver.  What to expect: Any bruising will usually fade within 1 to 2 weeks.  Blood that collects in the tissue (hematoma) may be painful to the touch. It should usually decrease in size and tenderness within 1 to 2 weeks.  SEEK IMMEDIATE MEDICAL CARE IF: You have unusual pain at the radial site.  You have redness, warmth, swelling, or pain at the radial site.  You have drainage (other than a small amount of blood on the dressing).  You have chills.  You have a fever or persistent symptoms for more than 72 hours.  You have a fever and your symptoms suddenly get worse.  Your arm becomes pale, cool, tingly, or numb.  You have heavy bleeding from the site. Hold pressure on the site.   PLEASE DO NOT MISS ANY DOSES OF YOUR BRILINTA!!!!! Also keep a log of you  blood pressures and bring back to your follow up appt. Please call the office with any questions.   Patients taking blood thinners should generally stay away from medicines like ibuprofen, Advil, Motrin, naproxen, and Aleve due to risk of stomach bleeding. You may take Tylenol as directed or talk to your primary doctor about alternatives.   Increase activity slowly   Complete by:  As directed       Discharge Medications     Medication List    STOP taking these medications   ONE-A-DAY 50 PLUS PO     TAKE these medications   amLODipine  10 MG tablet Commonly known as:  NORVASC Take 1 tablet (10 mg total) by mouth daily. Start taking on:  08/19/2017   aspirin EC 81 MG tablet Take 1 tablet (81 mg total) by mouth daily. What changed:  how much to take   atorvastatin 80 MG tablet Commonly known as:  LIPITOR Take 1 tablet (80 mg total) by mouth daily at 6 PM.   carvedilol 6.25 MG tablet Commonly known as:  COREG Take 1 tablet (6.25 mg total) by mouth 2 (two) times daily with a meal.   nitroGLYCERIN 0.4 MG SL tablet Commonly known as:  NITROSTAT Place 1 tablet (0.4 mg total) under the tongue every 5 (five) minutes x 3 doses as needed for chest pain.   ranitidine 75 MG tablet Commonly known as:  ZANTAC Take 75 mg by mouth daily as needed for heartburn.   ticagrelor 90 MG Tabs tablet Commonly known as:  BRILINTA Take 1 tablet (90 mg total) by mouth 2 (two) times daily.   traMADol 50 MG tablet Commonly known as:  ULTRAM Take 50 mg by mouth every 8 (eight) hours as needed (for pain).   ULTRA OMEGA-3 FISH OIL 1400 MG Caps Take 1 capsule by mouth daily.        Aspirin prescribed at discharge?  Yes High Intensity Statin Prescribed? (Lipitor 40-80mg  or Crestor 20-40mg ): Yes Beta Blocker Prescribed? Yes For EF <40%, was ACEI/ARB Prescribed? No: EF ok ADP Receptor Inhibitor Prescribed? (i.e. Plavix etc.-Includes Medically Managed Patients): Yes For EF <40%, Aldosterone  Inhibitor Prescribed? EF ok  Was EF assessed during THIS hospitalization? Yes Was Cardiac Rehab II ordered? (Included Medically managed Patients): Yes   Outstanding Labs/Studies   FLP/LFTs in 6 weeks  Duration of Discharge Encounter   Greater than 30 minutes including physician time.  Signed, Laverda Page NP-C 08/18/2017, 9:26 AM   I have personally seen and examined this patient. I agree with the assessment and plan as outlined above.  Mr. Roop was admitted with a NSTEMI. Cardiac cath yesterday with severe stenosis in the OM branch of the circ and in the mid LAD/Diagonal. Stents placed in the LAD and circumflex. Diagonal treated with balloon angioplasty.  He is doing well this am.  No chest pain or dyspnea.  My exam:   General: Well developed, well nourished, NAD  HEENT: OP clear, mucus membranes moist  SKIN: warm, dry. No rashes. Neuro: No focal deficits  Musculoskeletal: Muscle strength 5/5 all ext  Psychiatric: Mood and affect normal  Neck: No JVD, no carotid bruits, no thyromegaly, no lymphadenopathy.  Lungs:Clear bilaterally, no wheezes, rhonci, crackles Cardiovascular: Regular rate and rhythm. No murmurs, gallops or rubs. Abdomen:Soft. Bowel sounds present. Non-tender.  Extremities: No lower extremity edema. Pulses are 2 + in the bilateral DP/PT. Right radial pulse intact. No hematoma right wrist.   Plan: Will discharge home today with ASA/Brilinta for one year. Will continue statin and beta blocker. He was started on Norvasc for HTN. Will plan outpatient renal artery dopplers. Follow up with Dr. Rubie Maid one week.   Verne Carrow 08/18/2017 9:26 AM

## 2017-08-17 NOTE — Progress Notes (Signed)
Thank you MCr 

## 2017-08-17 NOTE — Progress Notes (Signed)
Co-pay $$45.00 30 day supply.Brilinta 90 mg.twice a day.  NO PA required

## 2017-08-17 NOTE — Progress Notes (Signed)
ANTICOAGULATION CONSULT NOTE - Follow Up Consult  Pharmacy Consult for heparin Indication: chest pain/ACS   Labs: Recent Labs    08/16/17 1536 08/16/17 2120 08/17/17 0032 08/17/17 0407  HGB 14.9  --   --  12.7*  HCT 44.4  --   --  38.0*  PLT 181  --   --  151  LABPROT  --   --   --  15.0  INR  --   --   --  1.19  HEPARINUNFRC  --   --   --  0.30  CREATININE 1.03  --   --   --   TROPONINI  --  1.58* 1.51*  --     Assessment: 77yo male therapeutic on heparin with initial dosing for CP though at very low end of goal.  Goal of Therapy:  Heparin level 0.3-0.7 units/ml   Plan:  Will increase heparin gtt slightly to 1000 units/hr and check level in 8 hours.   Vernard GamblesVeronda Caldwell Kronenberger, PharmD, BCPS  08/17/2017,5:19 AM

## 2017-08-17 NOTE — Progress Notes (Signed)
Progress Note  Patient Name: WORTH KOBER Date of Encounter: 08/17/2017  Primary Cardiologist: New (Croitoru)  Subjective   No chest pain overnight, or dyspnea.   Inpatient Medications    Scheduled Meds: . amLODipine  5 mg Oral Daily  . aspirin EC  81 mg Oral Daily  . atorvastatin  80 mg Oral q1800  . carvedilol  6.25 mg Oral BID WC  . pantoprazole  40 mg Oral Q0600   Continuous Infusions: . sodium chloride    . sodium chloride 1 mL/kg/hr (08/16/17 2304)  . heparin 1,000 Units/hr (08/17/17 0528)   PRN Meds: sodium chloride, acetaminophen, hydrALAZINE, nitroGLYCERIN, ondansetron (ZOFRAN) IV, traMADol   Vital Signs    Vitals:   08/16/17 2200 08/17/17 0530 08/17/17 0534 08/17/17 0817  BP: 140/78 134/83  (!) 160/90  Pulse: 66 (!) 55  65  Resp: 19 18    Temp: 98.3 F (36.8 C) 98 F (36.7 C)    TempSrc: Oral Oral    SpO2: 97% 98%    Weight:   158 lb 1.6 oz (71.7 kg)   Height:        Intake/Output Summary (Last 24 hours) at 08/17/2017 0829 Last data filed at 08/17/2017 6962 Gross per 24 hour  Intake 867.4 ml  Output 650 ml  Net 217.4 ml   Filed Weights   08/16/17 1849 08/16/17 2158 08/17/17 0534  Weight: 163 lb (73.9 kg) 158 lb 6.4 oz (71.8 kg) 158 lb 1.6 oz (71.7 kg)    Telemetry    SB with PACs - Personally Reviewed  ECG    SB with nonspecific TW changes in inferolateral leads - Personally Reviewed  Physical Exam   General: Well developed, well nourished, older W male appearing in no acute distress. Head: Normocephalic, atraumatic.  Neck: Supple without bruits, JVD. Lungs:  Resp regular and unlabored, CTA. Heart: RRR, S1, S2, no S3, S4, or murmur; no rub. Abdomen: Soft, non-tender, non-distended with normoactive bowel sounds.  Extremities: No clubbing, cyanosis, edema. Distal pedal pulses are 2+ bilaterally. Neuro: Alert and oriented X 3. Moves all extremities spontaneously. Psych: Normal affect.  Labs    Chemistry Recent Labs  Lab  08/16/17 1536 08/17/17 0407  NA 139 142  K 3.7 4.3  CL 102 108  CO2 26 26  GLUCOSE 126* 109*  BUN 20 21*  CREATININE 1.03 1.03  CALCIUM 9.3 8.6*  PROT  --  6.0*  ALBUMIN  --  3.2*  AST  --  36  ALT  --  26  ALKPHOS  --  47  BILITOT  --  0.7  GFRNONAA >60 >60  GFRAA >60 >60  ANIONGAP 11 8     Hematology Recent Labs  Lab 08/16/17 1536 08/17/17 0407  WBC 8.3 8.2  RBC 4.82 4.12*  HGB 14.9 12.7*  HCT 44.4 38.0*  MCV 92.1 92.2  MCH 30.9 30.8  MCHC 33.6 33.4  RDW 13.8 13.7  PLT 181 151    Cardiac Enzymes Recent Labs  Lab 08/16/17 2120 08/17/17 0032  TROPONINI 1.58* 1.51*    Recent Labs  Lab 08/16/17 1552 08/16/17 2144  TROPIPOC 0.37* 0.66*     BNPNo results for input(s): BNP, PROBNP in the last 168 hours.   DDimer No results for input(s): DDIMER in the last 168 hours.    Radiology    Dg Chest 2 View  Result Date: 08/16/2017 CLINICAL DATA:  Pt coming from urgent care with complaint of chest pain. He reports chest burning.  Was sent by UC for further assessment. Pt states pain is worse with exertion. Non smoker. Hx htn. EXAM: CHEST  2 VIEW COMPARISON:  09/05/2008 chest CT FINDINGS: Heart size is mildly enlarged. No focal consolidations or pulmonary edema. There is mild costophrenic angle blunting on the right, consistent with pleural thickening or small pleural effusion. Mild midthoracic spondylosis. IMPRESSION: 1. Cardiomegaly without pulmonary edema. 2. Right costophrenic angle blunting, which may be chronic. Electronically Signed   By: Norva PavlovElizabeth  Brown M.D.   On: 08/16/2017 17:54    Cardiac Studies   N/a   Patient Profile     77 y.o. male with PMH of HTN who presented with exertional chest discomfort and found to have positive troponins and abnormal EKG.   Assessment & Plan    1. NSTEMI: Troponin peaked at 1.58. Morning EKG without ischemic changes. No chest pain or shortness of breath overnight. Remains on IV heparin. Planned for cardiac cath today.    2. HTN: Was severely hypertensive on admission. Improved this morning. On BB and norvasc. He is bradycardiac, therefore no room to increase coreg. There was mention of possible RAS on previous CAT scan, with plans for renal artery US. Plan was to avoid RAAS inhibitors until this is clarified. Will order this morning.   3. HL: LDL 111, was started on high dose statin   Signed, Laverda PageLindsay Roberts, NP  08/17/2017, 8:29 AM  Pager # [phone]   For questions or updates, please contact [facility] Please consult [email or web address] for contact info under Cardiology/STEMI.  Patient seen, examined. Available data reviewed. Agree with findings, assessment, and plan as outlined by Laverda PageLindsay Roberts, NP-C. On my exam today: Vitals:   08/17/17 1130 08/17/17 1145  BP: 124/76 130/70  Pulse: (!) 50 (!) 47  Resp: 19 18  Temp:    SpO2: 95% 97%   Pt is alert and oriented, NAD HEENT: normal Neck: JVP - normal Lungs: CTA bilaterally CV: RRR without murmur or gallop Abd: soft, NT, Positive BS, no hepatomegaly Ext: no C/C/E, distal pulses intact and equal, TR band in place on right wrist with some venous engorgement of the hand but normal pleth signal Skin: warm/dry no rash  Cath films/PCI reviewed. Excellent result of severe LCx and LAD stenosis treated with stenting of both vessels. Transient dyspnea earlier suspect related to ticagrelor loading dose. Currently looks great no shortness of breath or chest pain. Agree with plans outlined above. Anticipate discharge tomorrow as long as no complications arise. Medications reviewed and appropriate with DAPT and high-intensity statin drug.  Tonny BollmanMichael Ali Mohl, M.D. 08/17/2017 12:04 PM

## 2017-08-17 NOTE — H&P (Signed)
SEE H&P from time of admission 08/16/2017 incorrectly labeled "PROGRESS NOTE"

## 2017-08-17 NOTE — Care Management Note (Addendum)
Case Management Note  Patient Details  Name: [name] MRN: [id number] Date of Birth: 10/24/1940  Subjective/Objective:    From home, pta indep, s/p heart cath, will txt medically no intervention done will need to be on bedrest for 6 hours post cath, then dc home. NCM gave patient the 30 day free coupon for brilinta, and informed him co pay amt of around 45.00 .  He is for dc today.    1/30 1028 Letha Capeeborah Beyonka Pitney RN, BSN- wife called and stated that pharmacist told her the co pay is 500.00.  NCM called pharmacist and he states a 30 day supply is 286.00.  NCM called Lillia AbedLindsay PA, informed her and she states to give him the 30 day free and they will switch him to something else after follow up.               Action/Plan: NCM will follow for dc needs.   Expected Discharge Date:                  Expected Discharge Plan:  Home/Self Care  In-House Referral:     Discharge planning Services  CM Consult  Post Acute Care Choice:    Choice offered to:     DME Arranged:    DME Agency:     HH Arranged:    HH Agency:     Status of Service:  Completed, signed off  If discussed at [employer] Length of Stay Meetings, dates discussed:    Additional Comments:  [name], Bobby Pittinger Clinton, RN 08/17/2017, 2:11 PM

## 2017-08-17 NOTE — Interval H&P Note (Signed)
History and Physical Interval Note:  08/17/2017 8:41 AM  [name]  has presented today for surgery, with the diagnosis of NSTEMI/Accelerated HTN.    The various methods of treatment have been discussed with the patient and family. After consideration of risks, benefits and other options for treatment, the patient has consented to  Procedure(s): LEFT HEART CATH AND CORONARY ANGIOGRAPHY (N/A) with possible PERCUTANEOUS CORONARY INTERVENTION as a surgical intervention .  The patient's history has been reviewed, patient examined, no change in status, stable for surgery.  I have reviewed the patient's chart and labs.  Questions were answered to the patient's satisfaction.    Cath Lab Visit (complete for each Cath Lab visit)  Clinical Evaluation Leading to the Procedure:   ACS: Yes.    Non-ACS:    Anginal Classification: CCS IV  Anti-ischemic medical therapy: Maximal Therapy (2 or more classes of medications)  Non-Invasive Test Results: No non-invasive testing performed  Prior CABG: No previous CABG   Bryan Lemmaavid Harding

## 2017-08-18 ENCOUNTER — Telehealth (HOSPITAL_COMMUNITY): Payer: Self-pay

## 2017-08-18 ENCOUNTER — Encounter (HOSPITAL_COMMUNITY): Payer: Self-pay | Admitting: Cardiology

## 2017-08-18 ENCOUNTER — Inpatient Hospital Stay (HOSPITAL_COMMUNITY): Payer: Medicare Other

## 2017-08-18 ENCOUNTER — Telehealth: Payer: Self-pay | Admitting: Physician Assistant

## 2017-08-18 DIAGNOSIS — E785 Hyperlipidemia, unspecified: Secondary | ICD-10-CM

## 2017-08-18 LAB — BASIC METABOLIC PANEL
Anion gap: 10 (ref 5–15)
BUN: 15 mg/dL (ref 6–20)
CHLORIDE: 105 mmol/L (ref 101–111)
CO2: 24 mmol/L (ref 22–32)
CREATININE: 0.97 mg/dL (ref 0.61–1.24)
Calcium: 8.5 mg/dL — ABNORMAL LOW (ref 8.9–10.3)
GFR calc Af Amer: >60 mL/min (ref >60)
GFR calc non Af Amer: >60 mL/min (ref >60)
GLUCOSE: 99 mg/dL (ref 65–99)
Potassium: 3.5 mmol/L (ref 3.5–5.1)
Sodium: 139 mmol/L (ref 135–145)

## 2017-08-18 LAB — CBC
HCT: 38.8 % — ABNORMAL LOW (ref 39.0–52.0)
Hemoglobin: 13 g/dL (ref 13.0–17.0)
MCH: 31 pg (ref 26.0–34.0)
MCHC: 33.5 g/dL (ref 30.0–36.0)
MCV: 92.4 fL (ref 78.0–100.0)
Platelets: 139 10*3/uL — ABNORMAL LOW (ref 150–400)
RBC: 4.2 MIL/uL — AB (ref 4.22–5.81)
RDW: 14 % (ref 11.5–15.5)
WBC: 9.3 10*3/uL (ref 4.0–10.5)

## 2017-08-18 MED ORDER — AMLODIPINE BESYLATE 5 MG PO TABS: 5.0000 mg | ORAL_TABLET | Freq: Every day | ORAL | 0 refills | Status: DC

## 2017-08-18 MED ORDER — CARVEDILOL 6.25 MG PO TABS: 6.2500 mg | ORAL_TABLET | Freq: Two times a day (BID) | ORAL | 2 refills | Status: DC

## 2017-08-18 MED ORDER — NITROGLYCERIN 0.4 MG SL SUBL: 0.4000 mg | SUBLINGUAL_TABLET | SUBLINGUAL | 2 refills | Status: DC | PRN

## 2017-08-18 MED ORDER — ATORVASTATIN CALCIUM 80 MG PO TABS: 80.0000 mg | ORAL_TABLET | Freq: Every day | ORAL | 1 refills | Status: DC

## 2017-08-18 MED ORDER — TICAGRELOR 90 MG PO TABS: 90.0000 mg | ORAL_TABLET | Freq: Two times a day (BID) | ORAL | 2 refills | Status: DC

## 2017-08-18 MED ORDER — ASPIRIN EC 81 MG PO TBEC: 81.0000 mg | DELAYED_RELEASE_TABLET | Freq: Every day | ORAL | Status: AC

## 2017-08-18 MED ORDER — AMLODIPINE BESYLATE 10 MG PO TABS: 10.0000 mg | ORAL_TABLET | Freq: Every day | ORAL | 0 refills | Status: DC

## 2017-08-18 NOTE — Progress Notes (Signed)
Pt's B/P =179/85;NP Lillia AbedLindsay was called & updated her with pt's.B/P & said it's ok to D/C pt.

## 2017-08-18 NOTE — Telephone Encounter (Signed)
Patient discharged on 08/18/17  First TOC attempt to be 08/19/17

## 2017-08-18 NOTE — Telephone Encounter (Signed)
Called patient to see interest level in Cardiac Rehab - Patient is not interested. Closed referral.

## 2017-08-18 NOTE — Progress Notes (Signed)
Discharge instructions given to pt.as well as to his wife via phone & both verbalized understanding the patient.was d/c home in fair condition & was accompanied by a friend

## 2017-08-18 NOTE — Telephone Encounter (Signed)
TOC Patient-Please call Patient-Patient has an appointment with Theodore Demarkhonda Barrett on 08-31-17.

## 2017-08-18 NOTE — Progress Notes (Signed)
CARDIAC REHAB PHASE I   PRE:  Rate/Rhythm: 72 pulse check (off monitor)    BP: sitting 183/101    SaO2: 97 RA  MODE:  Ambulation: 500 ft   POST:  Rate/Rhythm: 84 pulse check    BP: sitting 212/110, manual 5 min later 192/80     SaO2: 96 RA  Pt eager to d/c. BP elevated before and after walk. Sts his BP has been high in MD office but he didn't think much about it. Tolerated walk well, education complete. Understands importance of Brilinta/ASA. Will send referral to [facility].  [phone]   Harriet MassonRandi Kristan Amry Cathy CES, ACSM 08/18/2017 10:10 AM

## 2017-08-18 NOTE — Telephone Encounter (Signed)
Patients insurance is active and benefits verified through Medicare Part A & B - No co-pay, deductible amount of $185.00/$0.00 has been met, no out of pocket, 20% co-insurance, and no pre-authorization is required. Passport/reference [id number]  Patient will be contacted and scheduled after their follow up appt with the Cardiologist office upon review by the RN Navigator.

## 2017-08-19 NOTE — Telephone Encounter (Signed)
Patient contacted regarding discharge from 08/16/2017 - 08/18/2017 (2 days) [facility]  Patient understands to follow up with provider Janell Quiethonda Barrett,PA on 2-12 at 9am at [facility]. Notified of address/location. Patient understands discharge instructions? YES Patient understands medications and regiment? YES Patient understands to bring all medications to this visit? YES  PT STATES THAT IT IS OK TO SPEAK WITH WIFE AT ANYTIME TO DISCUSS TREATMENT. PT NOTIFIED THAT WE NEED WRITTEN AUTHORIZATION FOR THIS AND TO MAKE SURE FILL OUT FORM AT APPOINTMENT.

## 2017-08-19 NOTE — Telephone Encounter (Signed)
Unable to reach pt or leave a message  

## 2017-08-26 ENCOUNTER — Telehealth: Payer: Self-pay | Admitting: Physician Assistant

## 2017-08-26 NOTE — Telephone Encounter (Signed)
New message   Patient spouse calling with concerns that Brilinta is causing shortness of breath. Please call  Pt c/o medication issue:  1. Name of Medication: ticagrelor (BRILINTA) 90 MG TABS tablet 2. How are you currently taking this medication (dosage and times per day)?Take 1 tablet (90 mg total) by mouth 2 (two) times daily.   3. Are you having a reaction (difficulty breathing--STAT)? no  4. What is your medication issue? Questions about side effects, patient SOB   Pt c/o Shortness Of Breath: STAT if SOB developed within the last 24 hours or pt is noticeably SOB on the phone  1. Are you currently SOB (can you hear that pt is SOB on the phone)? Spouse calling  2. How long have you been experiencing SOB? 1 week  3. Are you SOB when sitting or when up moving around? Moving around  4. Are you currently experiencing any other symptoms? No

## 2017-08-26 NOTE — Telephone Encounter (Signed)
Will call back later pt not home and no dpr on file ./cy

## 2017-08-27 DIAGNOSIS — I2510 Atherosclerotic heart disease of native coronary artery without angina pectoris: Secondary | ICD-10-CM | POA: Diagnosis not present

## 2017-08-27 DIAGNOSIS — Z1389 Encounter for screening for other disorder: Secondary | ICD-10-CM | POA: Diagnosis not present

## 2017-08-27 DIAGNOSIS — R0789 Other chest pain: Secondary | ICD-10-CM | POA: Diagnosis not present

## 2017-08-27 DIAGNOSIS — K219 Gastro-esophageal reflux disease without esophagitis: Secondary | ICD-10-CM | POA: Diagnosis not present

## 2017-08-27 DIAGNOSIS — E7800 Pure hypercholesterolemia, unspecified: Secondary | ICD-10-CM | POA: Diagnosis not present

## 2017-08-27 DIAGNOSIS — I10 Essential (primary) hypertension: Secondary | ICD-10-CM | POA: Insufficient documentation

## 2017-08-27 DIAGNOSIS — I252 Old myocardial infarction: Secondary | ICD-10-CM | POA: Diagnosis not present

## 2017-08-31 ENCOUNTER — Telehealth: Payer: Self-pay | Admitting: Physician Assistant

## 2017-08-31 ENCOUNTER — Ambulatory Visit (INDEPENDENT_AMBULATORY_CARE_PROVIDER_SITE_OTHER): Payer: Medicare Other | Admitting: Physician Assistant

## 2017-08-31 ENCOUNTER — Encounter: Payer: Self-pay | Admitting: Physician Assistant

## 2017-08-31 VITALS — BP 149/85 | HR 70 | Ht 69.0 in | Wt 158.2 lb

## 2017-08-31 DIAGNOSIS — E785 Hyperlipidemia, unspecified: Secondary | ICD-10-CM | POA: Diagnosis not present

## 2017-08-31 DIAGNOSIS — I214 Non-ST elevation (NSTEMI) myocardial infarction: Secondary | ICD-10-CM

## 2017-08-31 DIAGNOSIS — I255 Ischemic cardiomyopathy: Secondary | ICD-10-CM

## 2017-08-31 DIAGNOSIS — I10 Essential (primary) hypertension: Secondary | ICD-10-CM | POA: Diagnosis not present

## 2017-08-31 MED ORDER — FUROSEMIDE 20 MG PO TABS: 20.0000 mg | ORAL_TABLET | Freq: Every day | ORAL | 0 refills | Status: DC

## 2017-08-31 MED ORDER — POTASSIUM CHLORIDE CRYS ER 20 MEQ PO TBCR: EXTENDED_RELEASE_TABLET | ORAL | 0 refills | Status: DC

## 2017-08-31 NOTE — Telephone Encounter (Signed)
Patient seen in the office today.

## 2017-08-31 NOTE — Progress Notes (Signed)
EF was 45-50% at cath, although LVEDP was 10 and we never did an echo. Maybe we should get an echo between now and his next appointment. MCr

## 2017-08-31 NOTE — Progress Notes (Signed)
Cardiology Office Note   Date:  08/31/2017   ID:  Omran, Keelin February 12, 1941, MRN [id number]  PCP:  [name], MD  Cardiologist: Dr. Royann Shivers, 08/17/2017 in hospital [name], PA-C   Chief Complaint  Patient presents with  . Follow-up    History of Present Illness: [name] is a 77 y.o. male with a history of hypertension  Admitted 1/28-1/30/2019 with non-STEMI and diagnosed with hyperlipidemia. S/p DES LAD x 2 & CFX x 1, d/c on high dose statin, Coreg and amlodipine as well as Brilinta 08/26/2017 phone notes regarding Brilinta possibly causing shortness of breath  [name] presents for cardiology follow up.  He keeps getting SOB. He denies LE edema. He rarely wakes during the night, never because of SOB.  He denies orthopnea.  He gets SOB w/ exertion, walking fast, caring for the livestock. He takes his time and gets things done. He did not take a break after his MI, but is doing less and doing it slower. He carries water in 2 gallon buckets only.  Prior to going into the hospital, he did not have the dyspnea on exertion.  He watches the sodium in his diet and is eating healthy.  He has BP records, has been taking it 5 x day. SBP range 101-143. DBP is ok. The lower BPs are generally after rx, higher BPs in the morning.   He wonders about drinking coffee and tea.   He has not had chest pain.   He rarely needs the Zantac. He has it because he thought sx were heartburn.    Past Medical History:  Diagnosis Date  . Hyperlipidemia   . Hypertension   . NSTEMI (non-ST elevated myocardial infarction) (HCC)    1/19 PCI/DES to LAD x2, and Lcx x1. Normal EF    Past Surgical History:  Procedure Laterality Date  . CORONARY BALLOON ANGIOPLASTY N/A 08/17/2017   Procedure: CORONARY BALLOON ANGIOPLASTY;  Surgeon: [name], MD;  Location: [facility] INVASIVE CV LAB;  Service: Cardiovascular;  Laterality: N/A;  . CORONARY STENT INTERVENTION N/A  08/17/2017   Procedure: CORONARY STENT INTERVENTION;  Surgeon: [name], MD;  Location: [facility] INVASIVE CV LAB;  Service: Cardiovascular;  Laterality: N/A;  . LEFT HEART CATH AND CORONARY ANGIOGRAPHY N/A 08/17/2017   Procedure: LEFT HEART CATH AND CORONARY ANGIOGRAPHY;  Surgeon: [name], MD;  Location: [facility] INVASIVE CV LAB;  Service: Cardiovascular;  Laterality: N/A;    Current Outpatient Medications  Medication Sig Dispense Refill  . amLODipine (NORVASC) 10 MG tablet Take 1 tablet (10 mg total) by mouth daily. 90 tablet 0  . aspirin EC 81 MG tablet Take 1 tablet (81 mg total) by mouth daily.    Marland Kitchen atorvastatin (LIPITOR) 80 MG tablet Take 1 tablet (80 mg total) by mouth daily at 6 PM. 90 tablet 1  . carvedilol (COREG) 6.25 MG tablet Take 1 tablet (6.25 mg total) by mouth 2 (two) times daily with a meal. 60 tablet 2  . nitroGLYCERIN (NITROSTAT) 0.4 MG SL tablet Place 1 tablet (0.4 mg total) under the tongue every 5 (five) minutes x 3 doses as needed for chest pain. 25 tablet 2  . Omega-3 Fatty Acids (ULTRA OMEGA-3 FISH OIL) 1400 MG CAPS Take 1 capsule by mouth daily.    . ranitidine (ZANTAC) 75 MG tablet Take 75 mg by mouth daily as needed for heartburn.    . ticagrelor (BRILINTA) 90 MG TABS tablet Take 1 tablet (  90 mg total) by mouth 2 (two) times daily. 180 tablet 2  . traMADol (ULTRAM) 50 MG tablet Take 50 mg by mouth every 8 (eight) hours as needed (for pain).     No current facility-administered medications for this visit.     Allergies:   Orange fruit [citrus]; Penicillins; and Wine [alcohol]    Social History:  The patient  reports that  has never smoked. he has never used smokeless tobacco. He reports that he does not drink alcohol.   Family History:  The patient's family history is not on file.    ROS:  Please see the history of present illness. All other systems are reviewed and negative.    PHYSICAL EXAM:  VS:  BP (!) 149/85   Pulse 70   Ht 5\' 9"  (1.753 m)   Wt  158 lb 3.2 oz (71.8 kg)   BMI 23.36 kg/m  , BMI Body mass index is 23.36 kg/m. GEN: Well nourished, well developed, male in no acute distress  HEENT: normal for age  Neck: JVD 9-10 cm, no carotid bruit, no masses Cardiac: RRR; soft murmur, no rubs, or gallops Respiratory: decreased BS bases bilaterally with some rales, normal work of breathing GI: soft, nontender, nondistended, + BS MS: no deformity or atrophy; no edema; distal pulses are 2+ in all 4 extremities   Skin: warm and dry, no rash Neuro:  Strength and sensation are intact Psych: euthymic mood, full affect  O2 sats with ambulation   oxygen  heart rate  Start  86%   72  During  76%  100  Finish  85%  84   Of note when patient stood still and took deep breaths, his O2 saturation increased to 93%   EKG:  EKG is ordered today. The ECG ordered today demonstrates SR, HR 63. No Q waves, minor ST changes inferior leads, no change from 08/18/2017 ECG.   CATH: 08/17/2017 Cath: 08/17/17 Conclusion    There is mild left ventricular systolic dysfunction. The left ventricular ejection fraction is 45-50% by visual estimate.  LV end diastolic pressure is normal.  -------------------------------------------------  Mid Cx lesion is 99% stenosed.  A drug-eluting stent was successfully placed using a STENT SYNERGY DES 2.5X12.  Post intervention, there is a 0% residual stenosis.  _____________________________________________________________________  Mid LAD-1 lesion is 95% stenosed. Mid LAD-2 lesion is 90% stenosed. Mid LAD to Dist LAD lesion is 80% stenosed.  2 Overlapping drug-eluting stents were successfully placed using a STENT SYNERGY DES 2.25X32 (2.4 mm) & STENT SYNERGY DES 2.75X16. (3.25 mm)  Post intervention, there is a 0% residual stenosis.  _____________________________________________________________________  Suezanne Jacquetst 2nd Diag lesion is 99% stenosed. (Bifurcation lesion - planned PTCA only)  Balloon angioplasty  was performed using a BALLOON WOLVERINE 2.50X10. Post intervention, there is a 50% residual stenosis.  Successful multisite PCI of the circumflex and LAD with PTCA of the D2. Mildly reduced EF with inferolateral hypokinesis.  Plan:  Return to 6 Central post procedure unit for post PCI management.  TR band we will per protocol.  DAPT for minimum 1 year -would continue Thienopyridine beyond  Post-Intervention Diagram          Recent Labs: 08/17/2017: ALT 26; TSH 1.804 08/18/2017: BUN 15; Creatinine, Ser 0.97; Hemoglobin 13.0; Platelets 139; Potassium 3.5; Sodium 139    Lipid Panel    Component Value Date/Time   CHOL 167 08/17/2017 0407   TRIG 39 08/17/2017 0407   HDL 48 08/17/2017 0407   CHOLHDL 3.5  08/17/2017 0407   VLDL 8 08/17/2017 0407   LDLCALC 111 (H) 08/17/2017 0407     Wt Readings from Last 3 Encounters:  08/31/17 158 lb 3.2 oz (71.8 kg)  08/18/17 158 lb (71.7 kg)     Other studies Reviewed: Additional studies/ records that were reviewed today include: Hospital records and testing.  ASSESSMENT AND PLAN:  1.  NSTEMI: continue ASA, Brilinta, high-dose Lipitor, BB, nitro prn. Advised him that if his breathing does not improve, can change the Brilinta to Plavix after 30 days. Continue to limit activity, encouraged him to attend cardiac rehab.  2. Hypoxia: He does not eat a high-Na diet, but has some volume overload on exam and O2 sats were low at baseline and dropped w/ exertion. Start Lasix 20 mg qd x 4 days w/ K+, then prn. Start daily weights. Recheck in 1-2 weeks w/ BMET.   3. ICM, EF 45-50% at cath: BP has been good at home, will drop some with the Lasix. If sx do not resolve w/ diuresis, add spironolactone or ARB and ck echo.   4. Dyslipidemia: goal LDL < 70. Continue high-dose statin, recheck in 3 months.   Current medicines are reviewed at length with the patient today.  The patient has concerns regarding medicines. Concerns were addressed  The  following changes have been made:  Add Lasix and Kdur  Labs/ tests ordered today include:   Orders Placed This Encounter  Procedures  . Basic Metabolic Panel (BMET)  . EKG 12-Lead     Disposition:   FU with Dr Royann Shivers  Signed, [name], PA-C  08/31/2017 12:10 PM    [facility] Phone: [phone]; Fax: [phone]  This note was written with the assistance of speech recognition software. Please excuse any transcriptional errors.

## 2017-08-31 NOTE — Telephone Encounter (Signed)
New message    Patient states he should have a prescription for Potassium tablet 20 meq daily for 4 days. No script at pharmacy. Please advise Call Tammy at [facility] [phone]

## 2017-08-31 NOTE — Patient Instructions (Addendum)
Medication Instructions:  Lasix (furosemide) 20 mg daily for 4 days, then as needed for worsening shortness of breath. Potassium tablet 20 meq daily for 4 days as well. Cut the amlodipine (Norvasc) in half while on the Lasix Call if you get light-headed or dizzy when you stand up and do not take any more Lasix.  Labwork: Your physician recommends that you return for lab work in: AT NEXT APPOINTMENT-BMET  Testing/Procedures: NONE   Follow-Up: Your physician recommends that you schedule a follow-up appointment in: 08/2016 with Bobby Demarkhonda Selyna Klahn, PA-C  Any Other Special Instructions Will Be Listed Below (If Applicable). If you need a refill on your cardiac medications before your next appointment, please call your pharmacy.

## 2017-08-31 NOTE — Telephone Encounter (Signed)
OV today, see medication instructions below:  Medication Instructions:  Lasix (furosemide) 20 mg daily for 4 days, then as needed for worsening shortness of breath. Potassium tablet 20 meq daily for 4 days as well. Cut the amlodipine (Norvasc) in half while on the Lasix Call if you get light-headed or dizzy when you stand up and do not take any more Lasix.    Rx sent to pharmacy for potassium x 4 days.    Returned call to pharmacy-[facility] rx has been sent.

## 2017-09-03 ENCOUNTER — Telehealth: Payer: Self-pay

## 2017-09-03 DIAGNOSIS — I519 Heart disease, unspecified: Secondary | ICD-10-CM

## 2017-09-03 DIAGNOSIS — I214 Non-ST elevation (NSTEMI) myocardial infarction: Secondary | ICD-10-CM

## 2017-09-03 NOTE — Telephone Encounter (Signed)
Barrett, Joline SaltRhonda G, PA-C  Arieon Scalzo, Marzella SchleinAngela M, CMA      EF was 45-50% at cath, although LVEDP was 10 and we never did an echo.  Maybe we should get an echo between now and his next appointment.  MCr    Echo ordered. Patient's appointment is on Monday, 09/06/17. RB will discuss with patient at that time.

## 2017-09-06 ENCOUNTER — Ambulatory Visit (INDEPENDENT_AMBULATORY_CARE_PROVIDER_SITE_OTHER): Payer: Medicare Other | Admitting: Physician Assistant

## 2017-09-06 ENCOUNTER — Encounter: Payer: Self-pay | Admitting: Physician Assistant

## 2017-09-06 VITALS — BP 136/62 | HR 74 | Ht 69.0 in | Wt 156.2 lb

## 2017-09-06 DIAGNOSIS — I255 Ischemic cardiomyopathy: Secondary | ICD-10-CM

## 2017-09-06 DIAGNOSIS — E785 Hyperlipidemia, unspecified: Secondary | ICD-10-CM

## 2017-09-06 DIAGNOSIS — R0902 Hypoxemia: Secondary | ICD-10-CM

## 2017-09-06 DIAGNOSIS — I214 Non-ST elevation (NSTEMI) myocardial infarction: Secondary | ICD-10-CM

## 2017-09-06 DIAGNOSIS — I10 Essential (primary) hypertension: Secondary | ICD-10-CM

## 2017-09-06 LAB — BASIC METABOLIC PANEL
BUN/Creatinine Ratio: 18 (ref 10–24)
BUN: 18 mg/dL (ref 8–27)
CALCIUM: 9.2 mg/dL (ref 8.6–10.2)
CO2: 26 mmol/L (ref 20–29)
CREATININE: 1.01 mg/dL (ref 0.76–1.27)
Chloride: 97 mmol/L (ref 96–106)
GFR calc Af Amer: 82 mL/min/{1.73_m2} (ref >59)
GFR, EST NON AFRICAN AMERICAN: 71 mL/min/{1.73_m2} (ref >59)
Glucose: 94 mg/dL (ref 65–99)
Potassium: 4.5 mmol/L (ref 3.5–5.2)
Sodium: 137 mmol/L (ref 134–144)

## 2017-09-06 MED ORDER — FUROSEMIDE 20 MG PO TABS: 20.0000 mg | ORAL_TABLET | ORAL | 0 refills | Status: DC | PRN

## 2017-09-06 MED ORDER — POTASSIUM CHLORIDE CRYS ER 20 MEQ PO TBCR: EXTENDED_RELEASE_TABLET | ORAL | 0 refills | Status: DC

## 2017-09-06 NOTE — Progress Notes (Signed)
Cardiology Office Note   Date:  09/06/2017   ID:  Bobby, Conner 1941-01-08, MRN [id number]  PCP:  [name], MD  Cardiologist: Dr. Royann Shivers, 08/17/2017 in hospital Bobby Demark, PA-C   Chief Complaint  Patient presents with  . Follow-up    History of Present Illness: [name] is a 77 y.o. male with a history of HTN, NSTEMI 07/2017 s/p DES LAD x 2 & CFX x 1, HLD, EF 45-50% at cath  08/31/2017 office visit, patient with some volume overload on exam and hypoxia, started on low-dose Lasix and early follow-up scheduled.  Echo recommended but could not be obtained prior to f/u visit  [name] Plan presents for follow-up  He is not breathing nearly as hard as he was. He is able to do more around the house.   Just started weighing daily, was 149 lbs. Thought his normal (dry) weight was 154 lbs.  He still gets SOB at times getting up in the night, but it resolves in a few minutes. Does not c/o orthopnea or PND.  He feels good about increasing his activity.  He is currently able to carry feed better do more chores around his house and less time than previously.  He wonders about his medications, does not want to take all this forever. Wonders if he can change from Brilinta to Plavix at 30 days, the Brilinita will cost $300/month.  He rarely coughs, no fevers, cough is not productive. Does not feel sick at all.    Past Medical History:  Diagnosis Date  . Hyperlipidemia   . Hypertension   . NSTEMI (non-ST elevated myocardial infarction) (HCC) 08/16/2017   s/p PCI/DES to LAD x2, and Lcx x1. Normal EF    Past Surgical History:  Procedure Laterality Date  . CORONARY BALLOON ANGIOPLASTY N/A 08/17/2017   Procedure: CORONARY BALLOON ANGIOPLASTY;  Surgeon: [name], MD;  Location: [facility] INVASIVE CV LAB;  Service: Cardiovascular;  Laterality: N/A;  . CORONARY STENT INTERVENTION N/A 08/17/2017   Procedure: CORONARY STENT INTERVENTION;  Surgeon: [name], MD;  Location: [facility] INVASIVE CV LAB;  Service: Cardiovascular;  Laterality: N/A;  . LEFT HEART CATH AND CORONARY ANGIOGRAPHY N/A 08/17/2017   Procedure: LEFT HEART CATH AND CORONARY ANGIOGRAPHY;  Surgeon: [name], MD;  Location: [facility] INVASIVE CV LAB;  Service: Cardiovascular;  Laterality: N/A;    Current Outpatient Medications  Medication Sig Dispense Refill  . amLODipine (NORVASC) 10 MG tablet Take 1 tablet (10 mg total) by mouth daily. 90 tablet 0  . aspirin EC 81 MG tablet Take 1 tablet (81 mg total) by mouth daily.    Marland Kitchen atorvastatin (LIPITOR) 80 MG tablet Take 1 tablet (80 mg total) by mouth daily at 6 PM. 90 tablet 1  . carvedilol (COREG) 6.25 MG tablet Take 1 tablet (6.25 mg total) by mouth 2 (two) times daily with a meal. 60 tablet 2  . nitroGLYCERIN (NITROSTAT) 0.4 MG SL tablet Place 1 tablet (0.4 mg total) under the tongue every 5 (five) minutes x 3 doses as needed for chest pain. 25 tablet 2  . ticagrelor (BRILINTA) 90 MG TABS tablet Take 1 tablet (90 mg total) by mouth 2 (two) times daily. 180 tablet 2  . furosemide (LASIX) 20 MG tablet Take 1 tablet (20 mg total) by mouth daily. (Patient not taking: Reported on 09/06/2017) 30 tablet 0  . Omega-3 Fatty Acids (ULTRA OMEGA-3 FISH OIL) 1400 MG CAPS Take 1 capsule by  mouth daily.    . potassium chloride SA (K-DUR,KLOR-CON) 20 MEQ tablet Take 20 meq (1 tablet) daily for 4 days (Patient not taking: Reported on 09/06/2017) 4 tablet 0  . ranitidine (ZANTAC) 75 MG tablet Take 75 mg by mouth daily as needed for heartburn.    . traMADol (ULTRAM) 50 MG tablet Take 50 mg by mouth every 8 (eight) hours as needed (for pain).     No current facility-administered medications for this visit.     Allergies:   Orange fruit [citrus]; Penicillins; and Wine [alcohol]    Social History:  The patient  reports that  has never smoked. he has never used smokeless tobacco. He reports that he does not drink alcohol.   Family History:  The  patient's family history is not on file.    ROS:  Please see the history of present illness. All other systems are reviewed and negative.    PHYSICAL EXAM: VS:  BP 136/62   Pulse 74   Ht 5\' 9"  (1.753 m)   Wt 156 lb 3.2 oz (70.9 kg)   SpO2 97%   BMI 23.07 kg/m  , BMI Body mass index is 23.07 kg/m. GEN: Well nourished, well developed, male in no acute distress  HEENT: normal for age  Neck: minimal JVD, no carotid bruit, no masses Cardiac: RRR; soft murmur, no rubs, or gallops Respiratory:  Rales R base, decreased BS L base, normal work of breathing GI: soft, nontender, nondistended, + BS MS: no deformity or atrophy; no edema; distal pulses are 2+ in all 4 extremities   Skin: warm and dry, no rash Neuro:  Strength and sensation are intact Psych: euthymic mood, full affect   EKG:  EKG is not ordered today.  Recent Labs: 08/17/2017: ALT 26; TSH 1.804 08/18/2017: BUN 15; Creatinine, Ser 0.97; Hemoglobin 13.0; Platelets 139; Potassium 3.5; Sodium 139    Lipid Panel    Component Value Date/Time   CHOL 167 08/17/2017 0407   TRIG 39 08/17/2017 0407   HDL 48 08/17/2017 0407   CHOLHDL 3.5 08/17/2017 0407   VLDL 8 08/17/2017 0407   LDLCALC 111 (H) 08/17/2017 0407     Wt Readings from Last 3 Encounters:  09/06/17 156 lb 3.2 oz (70.9 kg)  08/31/17 158 lb 3.2 oz (71.8 kg)  08/18/17 158 lb (71.7 kg)     Other studies Reviewed: Additional studies/ records that were reviewed today include: office notes, hospital records and testing.  ASSESSMENT AND PLAN:  1.  NSTEMI: Pt tolerating meds well. SOB was from volume overload, not from the Brilinta. Cost is an issue, he requests samples to help with the cost, but agrees to take for a total of 3 months. Change to Plavix in 3 months. Ck echo.   He was advised that the ASA, BB and statin are long-term. DAPT for a year or more.  After speaking with the pharmacist, patient was called and asked to check with his insurance company to see if  the cost of  Brilinta was related to his deductible for the cost would be the same for a whole year.  Once he pays a deductible, he may be able to afford it  2. HTN: BP is under good control on current rx, no changes.  3. Dyslipidemia: discussed that goal LDL is < 70, continue rx.  4. Hypoxia: Patient was ambulated in the office and did well, O2 sat stayed above 80% the whole time.  He did not feel short of breath.  Echo was ordered to check for diastolic dysfunction, no symptoms of heart failure or volume overload at this time.  Current medicines are reviewed at length with the patient today.  The patient has concerns regarding medicines.  Concerns were addressed.  The following changes have been made:  no change  Labs/ tests ordered today include:  No orders of the defined types were placed in this encounter.    Disposition:   FU with Dr. Royann Shiversroitoru  Signed, Bobby Demarkhonda Barrett, PA-C  09/06/2017 11:22 AM    [facility] Phone: [phone]; Fax: [phone]  This note was written with the assistance of speech recognition software. Please excuse any transcriptional errors.

## 2017-09-06 NOTE — Patient Instructions (Signed)
Medication Instructions: Your physician recommends that you continue on your current medications as directed. Please refer to the Current Medication list given to you today.  If you need a refill on your cardiac medications before your next appointment, please call your pharmacy.    Procedures/Testing: Your physician has requested that you have an echocardiogram before your appointment with Dr. Royann Shiversroitoru. Echocardiography is a painless test that uses sound waves to create images of your heart. It provides your doctor with information about the size and shape of your heart and how well your heart's chambers and valves are working. This procedure takes approximately one hour. There are no restrictions for this procedure. This will take place at [address], [address].   Follow-Up: Your physician wants you to follow-up in: 3 months with Dr. Royann Shiversroitoru. You will receive a reminder letter in the mail two months in advance. If you don't receive a letter, please call our office at [phone] to schedule this follow-up appointment.   Special Instructions:  Please keep checking your daily weight and keep a log. You may take the furosemide and the potassium for a weight gain of 3 pounds overnight or 5 pounds in a week. Increase your exercise gradually for the next month with a exerction level of 5/10. Try to keep 30 minutes of sustained activity, 5 days a week.   Thank you for choosing [facility]!!

## 2017-09-07 ENCOUNTER — Telehealth: Payer: Self-pay

## 2017-09-07 NOTE — Telephone Encounter (Signed)
See previous lab results;  Spoke with patient and he stated that the Brilinta is expensive and he would like to go on Plavix because it is cheaper.

## 2017-09-07 NOTE — Telephone Encounter (Signed)
-----   Message from Darrol Jumphonda G Barrett, PA-C sent at 09/07/2017  2:59 PM EST ----- Pt of Dr Royann Shiversroitoru Please let him know his labs were ok. No problem. Please make sure he called his insurance co to see if his high Brilinta cost is related to a deductible, or will it continue to cost him that much. Thanks

## 2017-09-07 NOTE — Progress Notes (Signed)
Thanks MCr 

## 2017-10-01 ENCOUNTER — Telehealth: Payer: Self-pay

## 2017-10-01 MED ORDER — CLOPIDOGREL BISULFATE 75 MG PO TABS: ORAL_TABLET | ORAL | 6 refills | Status: DC

## 2017-10-01 MED ORDER — CLOPIDOGREL BISULFATE 75 MG PO TABS
ORAL_TABLET | ORAL | 0 refills | Status: DC
Start: 2017-10-01 — End: 2017-10-01

## 2017-10-01 NOTE — Telephone Encounter (Signed)
Spoke with patients wife per DRP, informed her of the medication change (Discontine Brilinta and start Plavix) and she verbalized understanding.

## 2017-10-01 NOTE — Telephone Encounter (Deleted)
-----   Message from Darrol Jumphonda G Barrett, PA-C sent at 10/01/2017 12:11 PM EDT ----- Regarding: RE: medication question Sorry, never heard back if the high cost of Brilinta was related to a deductible or if it will cost that much all the time. If it will always be expensive, change to Plavix by taking 300 mg as a one-time dose, then start 75 mg daily the next day.  Thanks  ----- Message ----- From: Lucita FerraraYoung, Lavera Vandermeer T, CMA Sent: 09/28/2017   9:13 AM To: Darrol Jumphonda G Barrett, PA-C Subject: medication question                            Steve RattlerHey Rhonda,  I sent you a phone message about this patient and never heard back so im following up. Can you take a look at the phone message and advise at your earliest convenience.  Thanks,  Viacomee

## 2017-10-01 NOTE — Telephone Encounter (Signed)
Opened in error see previous phone note.

## 2017-10-01 NOTE — Telephone Encounter (Signed)
-----   Message from Rhonda G Barrett, PA-C sent at 10/01/2017 12:11 PM EDT ----- Regarding: RE: medication question Sorry, never heard back if the high cost of Brilinta was related to a deductible or if it will cost that much all the time. If it will always be expensive, change to Plavix by taking 300 mg as a one-time dose, then start 75 mg daily the next day.  Thanks  ----- Message ----- From: Llesenia Fogal T, CMA Sent: 09/28/2017   9:13 AM To: Rhonda G Barrett, PA-C Subject: medication question                            Hey Rhonda,  I sent you a phone message about this patient and never heard back so im following up. Can you take a look at the phone message and advise at your earliest convenience.  Thanks,  Tee  

## 2017-10-06 ENCOUNTER — Other Ambulatory Visit: Payer: Self-pay | Admitting: Cardiovascular Disease

## 2017-10-06 MED ORDER — CLOPIDOGREL BISULFATE 75 MG PO TABS: ORAL_TABLET | ORAL | 6 refills | Status: DC

## 2017-10-06 NOTE — Telephone Encounter (Signed)
New message   Pharmacy needs to confirm  Instructions for  clopidogrel (PLAVIX) 75 MG tablet Take 4 tablets for your fir then 1 tablet once a day there after

## 2017-10-06 NOTE — Telephone Encounter (Signed)
Rx(s) sent to pharmacy electronically.  

## 2017-10-18 ENCOUNTER — Telehealth: Payer: Self-pay | Admitting: Cardiovascular Disease

## 2017-10-18 ENCOUNTER — Encounter: Payer: Self-pay | Admitting: *Deleted

## 2017-10-18 NOTE — Telephone Encounter (Signed)
Returned call to wife, wanted to verify that he needs to take 4 tablets of Plavix and ASA 81 mg the first day.  Advised per rx take 4 tablets today only (1st day) and ASA 81 mg, then 1 tablet Plavix and ASA 81 mg daily after.   Wife aware and verbalized understanding.

## 2017-10-18 NOTE — Telephone Encounter (Signed)
New message  Pt c/o medication issue:  1. Name of Medication: PLAVIX , ASPIRIN  2. How are you currently taking this medication (dosage and times per day)? AS PRESCRIBED  3. Are you having a reaction (difficulty breathing--STAT)? NO  4. What is your medication issue? WANTS TO CLARIFY  INSTRUCTIONS

## 2017-10-25 ENCOUNTER — Other Ambulatory Visit: Payer: Self-pay | Admitting: Cardiovascular Disease

## 2017-10-25 MED ORDER — CARVEDILOL 6.25 MG PO TABS: 6.2500 mg | ORAL_TABLET | Freq: Two times a day (BID) | ORAL | 3 refills | Status: DC

## 2017-10-25 MED ORDER — AMLODIPINE BESYLATE 10 MG PO TABS: 10.0000 mg | ORAL_TABLET | Freq: Every day | ORAL | 3 refills | Status: DC

## 2017-10-25 NOTE — Telephone Encounter (Signed)
New message     *STAT* If patient is at the pharmacy, call can be transferred to refill team.   1. Which medications need to be refilled? (please list name of each medication and dose if known) amLODipine (NORVASC) 10 MG tablet and carvedilol (COREG) 6.25 MG tablet  2. Which pharmacy/location (including street and city if local pharmacy) is medication to be sent to?[id number] - [place], [place] - [address]  3. Do they need a 30 day or 90 day supply? 90

## 2017-10-25 NOTE — Telephone Encounter (Signed)
Rx(s) sent to pharmacy electronically.  

## 2017-11-12 ENCOUNTER — Ambulatory Visit (HOSPITAL_COMMUNITY): Payer: Medicare Other | Attending: Cardiovascular Disease

## 2017-11-12 ENCOUNTER — Other Ambulatory Visit: Payer: Self-pay

## 2017-11-12 DIAGNOSIS — I519 Heart disease, unspecified: Secondary | ICD-10-CM | POA: Diagnosis not present

## 2017-11-12 DIAGNOSIS — I214 Non-ST elevation (NSTEMI) myocardial infarction: Secondary | ICD-10-CM | POA: Diagnosis not present

## 2017-11-12 DIAGNOSIS — E785 Hyperlipidemia, unspecified: Secondary | ICD-10-CM | POA: Insufficient documentation

## 2017-11-12 DIAGNOSIS — I340 Nonrheumatic mitral (valve) insufficiency: Secondary | ICD-10-CM | POA: Insufficient documentation

## 2017-11-12 DIAGNOSIS — I119 Hypertensive heart disease without heart failure: Secondary | ICD-10-CM | POA: Insufficient documentation

## 2017-11-12 DIAGNOSIS — I252 Old myocardial infarction: Secondary | ICD-10-CM | POA: Diagnosis not present

## 2017-12-01 ENCOUNTER — Encounter: Payer: Self-pay | Admitting: Cardiovascular Disease

## 2017-12-03 ENCOUNTER — Ambulatory Visit (INDEPENDENT_AMBULATORY_CARE_PROVIDER_SITE_OTHER): Payer: Medicare Other | Admitting: Cardiovascular Disease

## 2017-12-03 ENCOUNTER — Encounter: Payer: Self-pay | Admitting: Cardiovascular Disease

## 2017-12-03 VITALS — BP 138/74 | HR 62 | Ht 69.0 in | Wt 154.0 lb

## 2017-12-03 DIAGNOSIS — I10 Essential (primary) hypertension: Secondary | ICD-10-CM | POA: Diagnosis not present

## 2017-12-03 DIAGNOSIS — I255 Ischemic cardiomyopathy: Secondary | ICD-10-CM

## 2017-12-03 DIAGNOSIS — E7800 Pure hypercholesterolemia, unspecified: Secondary | ICD-10-CM | POA: Diagnosis not present

## 2017-12-03 DIAGNOSIS — I2510 Atherosclerotic heart disease of native coronary artery without angina pectoris: Secondary | ICD-10-CM | POA: Diagnosis not present

## 2017-12-03 NOTE — Progress Notes (Signed)
**Note Bobby-Identified via Obfuscation** Cardiology Office Note:    Date:  12/03/2017   ID:  [name], DOB 08/05/1940, MRN [id number]  PCP:  [name], MD  Cardiologist:  Thurmon Fair, MD   Referring MD: [name], MD   Chief Complaint  Patient presents with  . Follow-up    3 months post PCI  . Edema    Ankles and feet.    History of Present Illness:    [name] is a 77 y.o. male with a hx of coronary artery disease presenting with a very small non-STEMI in January 2019, followed by two-vessel revascularization with placement of drug-eluting stents to the mid LAD artery (overlapping STENT SYNERGY DES 2.25X32 (2.4 mm) & STENT SYNERGY DES 2.75X16. (3.25 mm), with risk angioplasty of the second diagonal artery) and left circumflex coronary artery (STENT SYNERGY DES 2.5X12.).  Left ventricular systolic function was mildly depressed with an EF of 45% at presentation, but a repeat echocardiogram performed in April showed complete normalization of wall motion and overall EF.  He also has a long-standing but well treated hypertension.   He has done very well since his revascularization procedure.  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  He does have occasional lower right chest pain related to a previous surgical procedure, which has been a chronic complaint.  He has noticed that he bleeds a little longer if he nicks himself while working on the farm, but has not had any serious bleeding.  He denies palpitations, dizziness, syncope, headaches, focal neurological complaints GI complaints.  He has been very careful with his diet and has lost weight.  He exercises regularly at baseline his LDL cholesterol was 111 and he is currently on atorvastatin 80 daily, which has not caused any side effects he monitors his blood pressure regularly has recorded fairly consistent readings in the 110-120/70-75 range.  Any elevated blood pressure recordings at  home over 130/80.  This remains true even when he briefly interrupted amlodipine for a total of 5 days.  He noticed that chronic ankle swelling (1-2+, resolved after overnight recumbent position) improved markedly when he stopped the amlodipine.  He is planning some dental procedures with Dr. Osa Craver .  He understands that he cannot stop his dual antiplatelet therapy least until August of this year.  Past Medical History:  Diagnosis Date  . Hyperlipidemia   . Hypertension   . NSTEMI (non-ST elevated myocardial infarction) (HCC) 08/16/2017   s/p PCI/DES to LAD x2, and Lcx x1. Normal EF    Past Surgical History:  Procedure Laterality Date  . CORONARY BALLOON ANGIOPLASTY N/A 08/17/2017   Procedure: CORONARY BALLOON ANGIOPLASTY;  Surgeon: [name], MD;  Location: [facility] INVASIVE CV LAB;  Service: Cardiovascular;  Laterality: N/A;  . CORONARY STENT INTERVENTION N/A 08/17/2017   Procedure: CORONARY STENT INTERVENTION;  Surgeon: [name], MD;  Location: [facility] INVASIVE CV LAB;  Service: Cardiovascular;  Laterality: N/A;  . LEFT HEART CATH AND CORONARY ANGIOGRAPHY N/A 08/17/2017   Procedure: LEFT HEART CATH AND CORONARY ANGIOGRAPHY;  Surgeon: [name], MD;  Location: [facility] INVASIVE CV LAB;  Service: Cardiovascular;  Laterality: N/A;    Current Medications: Current Meds  Medication Sig  . aspirin EC 81 MG tablet Take 1 tablet (81 mg total) by mouth daily.  Marland Kitchen atorvastatin (LIPITOR) 80 MG tablet Take 1 tablet (80 mg total) by mouth daily at 6 PM.  . carvedilol (  COREG) 6.25 MG tablet Take 1 tablet (6.25 mg total) by mouth 2 (two) times daily with a meal.  . clopidogrel (PLAVIX) 75 MG tablet Take 4 tablets for your first dose then 1 tablet by mouth daily.  . nitroGLYCERIN (NITROSTAT) 0.4 MG SL tablet Place 1 tablet (0.4 mg total) under the tongue every 5 (five) minutes x 3 doses as needed for chest pain.  . Omega-3 Fatty Acids (ULTRA OMEGA-3 FISH OIL) 1400 MG CAPS Take 1  capsule by mouth daily.  . ranitidine (ZANTAC) 75 MG tablet Take 75 mg by mouth daily as needed for heartburn.  . traMADol (ULTRAM) 50 MG tablet Take 50 mg by mouth every 8 (eight) hours as needed (for pain).  . [DISCONTINUED] amLODipine (NORVASC) 10 MG tablet Take 1 tablet (10 mg total) by mouth daily.  . [DISCONTINUED] potassium chloride SA (K-DUR,KLOR-CON) 20 MEQ tablet As needed with the furosemide for a weight gain of 3 pounds overnight or 5 pounds in a week.     Allergies:   Orange fruit [citrus]; Penicillins; and Wine [alcohol]   Social History   Socioeconomic History  . Marital status: Single    Spouse name: Not on file  . Number of children: Not on file  . Years of education: Not on file  . Highest education level: Not on file  Occupational History  . Not on file  Social Needs  . Financial resource strain: Not on file  . Food insecurity:    Worry: Not on file    Inability: Not on file  . Transportation needs:    Medical: Not on file    Non-medical: Not on file  Tobacco Use  . Smoking status: Never Smoker  . Smokeless tobacco: Never Used  Substance and Sexual Activity  . Alcohol use: No    Frequency: Never  . Drug use: Never  . Sexual activity: Never  Lifestyle  . Physical activity:    Days per week: Not on file    Minutes per session: Not on file  . Stress: Not on file  Relationships  . Social connections:    Talks on phone: Not on file    Gets together: Not on file    Attends religious service: Not on file    Active member of club or organization: Not on file    Attends meetings of clubs or organizations: Not on file    Relationship status: Not on file  Other Topics Concern  . Not on file  Social History Narrative  . Not on file     Family History: The patient's family history is significantly negative for early onset coronary/vascular disease. ROS:   Please see the history of present illness.     All other systems reviewed and are  negative.  EKGs/Labs/Other Studies Reviewed:    The following studies were reviewed today:  ECHO 11/12/2017 - Left ventricle: The cavity size was normal. Wall thickness was   normal. Systolic function was normal. The estimated ejection   fraction was in the range of 55% to 60%. Wall motion was normal;   there were no regional wall motion abnormalities. Left   ventricular diastolic function parameters were normal. - Mitral valve: There was mild regurgitation.  Cardiac catheterization August 17 2017  There is mild left ventricular systolic dysfunction. The left ventricular ejection fraction is 45-50% by visual estimate.  LV end diastolic pressure is normal.  -------------------------------------------------  Mid Cx lesion is 99% stenosed.  A drug-eluting stent was successfully placed  using a STENT SYNERGY DES 2.5X12.  Post intervention, there is a 0% residual stenosis.  _____________________________________________________________________  Mid LAD-1 lesion is 95% stenosed. Mid LAD-2 lesion is 90% stenosed. Mid LAD to Dist LAD lesion is 80% stenosed.  2 Overlapping drug-eluting stents were successfully placed using a STENT SYNERGY DES 2.25X32 (2.4 mm) & STENT SYNERGY DES 2.75X16. (3.25 mm)  Post intervention, there is a 0% residual stenosis.  _____________________________________________________________________  Suezanne Jacquet 2nd Diag lesion is 99% stenosed. (Bifurcation lesion - planned PTCA only)  Balloon angioplasty was performed using a BALLOON WOLVERINE 2.50X10. Post intervention, there is a 50% residual stenosis.   Successful multisite PCI of the circumflex and LAD with PTCA of the D2. Mildly reduced EF with inferolateral hypokinesis.   EKG:  EKG is not ordered today.    Recent Labs: 08/17/2017: ALT 26; TSH 1.804 08/18/2017: Hemoglobin 13.0; Platelets 139 09/06/2017: BUN 18; Creatinine, Ser 1.01; Potassium 4.5; Sodium 137  Recent Lipid Panel    Component Value Date/Time    CHOL 167 08/17/2017 0407   TRIG 39 08/17/2017 0407   HDL 48 08/17/2017 0407   CHOLHDL 3.5 08/17/2017 0407   VLDL 8 08/17/2017 0407   LDLCALC 111 (H) 08/17/2017 0407    Physical Exam:    VS:  BP 138/74 (BP Location: Left Arm, Patient Position: Sitting, Cuff Size: Normal)   Pulse 62   Ht  (1.753 m)   Wt 154 lb (69.9 kg)   BMI 22.74 kg/m     Wt Readings from Last 3 Encounters:  12/03/17 154 lb (69.9 kg)  09/06/17 156 lb 3.2 oz (70.9 kg)  08/31/17 158 lb 3.2 oz (71.8 kg)     GEN: Appears very lean and fit, younger than stated age, well nourished, well developed in no acute distress HEENT: Normal NECK: No JVD; No carotid bruits LYMPHATICS: No lymphadenopathy CARDIAC: RRR, no murmurs, rubs, gallops RESPIRATORY:  Clear to auscultation without rales, wheezing or rhonchi  ABDOMEN: Soft, non-tender, non-distended MUSCULOSKELETAL:  No edema; No deformity  SKIN: Warm and dry NEUROLOGIC:  Alert and oriented x 3 PSYCHIATRIC:  Normal affect   ASSESSMENT:    1. Coronary artery disease involving native coronary artery of native heart without angina pectoris   2. Hypercholesterolemia   3. Essential hypertension    PLAN:    In order of problems listed above:  1. CAD: Despite presenting with an acute coronary syndrome, he did not have any meaningful permanent myocardial injury.  He is completely asymptomatic after two-vessel revascularization.  We discussed the potential for in-stent restenosis over the next 7-8 months, especially since he received a total of 3 stents and had plain balloon angioplasty of the second diagonal artery.  He understands that he cannot interrupt dual antiplatelet therapy at least for 6 months after the procedure, through the end of July.  After that he could temporarily interrupt Plavix if absolutely necessary for medical procedures, but ideally he will delay any elective procedures that require stopping antiplatelet therapies until next year.   2. HLP:  Plan to recheck a lipid profile at his one-year appointment, meanwhile keep on high-dose atorvastatin. 3. HTN: His blood pressure control is excellent and possibly excessive.  He has lower extremity edema from amlodipine and I asked him to discontinue this medication.  He will send some home blood pressure recordings in about a couple of weeks, when the full effect of amlodipine has resolved.  If his blood pressure is over 1 30-80, rather than restarting amlodipine I would  recommend using a higher dose of carvedilol.  He has not taken diuretics or potassium supplements in months.   Medication Adjustments/Labs and Tests Ordered: Current medicines are reviewed at length with the patient today.  Concerns regarding medicines are outlined above.  No orders of the defined types were placed in this encounter.  No orders of the defined types were placed in this encounter.   Patient Instructions  Dr Royann Shivers has recommended making the following medication changes: 1. STOP Amlodipine  Your physician has requested that you regularly monitor your blood pressure at home. Please use the same machine to check your blood pressure daily. Keep a record of your blood pressures using the log sheet provided. In 2 weeks, please report your readings back to Dr C. You may use our online patient portal 'MyChart' or you can call the office to speak with a nurse.  Dr Royann Shivers recommends that you schedule a follow-up appointment in 8 months. You will receive a reminder letter in the mail two months in advance. If you don't receive a letter, please call our office to schedule the follow-up appointment.  If you need a refill on your cardiac medications before your next appointment, please call your pharmacy.     Signed, Thurmon Fair, MD  12/03/2017 7:18 PM    [facility]

## 2017-12-03 NOTE — Patient Instructions (Addendum)
Dr Royann Shivers has recommended making the following medication changes: 1. STOP Amlodipine  Your physician has requested that you regularly monitor your blood pressure at home. Please use the same machine to check your blood pressure daily. Keep a record of your blood pressures using the log sheet provided. In 2 weeks, please report your readings back to Dr C. You may use our online patient portal 'MyChart' or you can call the office to speak with a nurse.  Dr Royann Shivers recommends that you schedule a follow-up appointment in 8 months. You will receive a reminder letter in the mail two months in advance. If you don't receive a letter, please call our office to schedule the follow-up appointment.  If you need a refill on your cardiac medications before your next appointment, please call your pharmacy.

## 2017-12-04 ENCOUNTER — Encounter: Payer: Self-pay | Admitting: Cardiovascular Disease

## 2017-12-17 ENCOUNTER — Encounter: Payer: Self-pay | Admitting: Cardiovascular Disease

## 2017-12-23 ENCOUNTER — Encounter: Payer: Self-pay | Admitting: Cardiovascular Disease

## 2017-12-23 MED ORDER — LISINOPRIL 5 MG PO TABS: 5.0000 mg | ORAL_TABLET | Freq: Every day | ORAL | 5 refills | Status: DC

## 2017-12-29 ENCOUNTER — Encounter: Payer: Self-pay | Admitting: Cardiovascular Disease

## 2018-02-08 ENCOUNTER — Telehealth: Payer: Self-pay | Admitting: Cardiovascular Disease

## 2018-02-08 MED ORDER — ATORVASTATIN CALCIUM 80 MG PO TABS: 80.0000 mg | ORAL_TABLET | Freq: Every day | ORAL | 1 refills | Status: DC

## 2018-02-08 NOTE — Telephone Encounter (Signed)
New Message:         *STAT* If patient is at the pharmacy, call can be transferred to refill team.   1. Which medications need to be refilled? (please list name of each medication and dose if known) atorvastatin (LIPITOR) 80 MG tablet  2. Which pharmacy/location (including street and city if local pharmacy) is medication to be sent to?[id number] - [place], [place] - [address]  3. Do they need a 30 day or 90 day supply? 90

## 2018-02-17 DIAGNOSIS — H524 Presbyopia: Secondary | ICD-10-CM | POA: Diagnosis not present

## 2018-02-17 DIAGNOSIS — H25812 Combined forms of age-related cataract, left eye: Secondary | ICD-10-CM | POA: Diagnosis not present

## 2018-02-17 DIAGNOSIS — H26491 Other secondary cataract, right eye: Secondary | ICD-10-CM | POA: Diagnosis not present

## 2018-03-28 DIAGNOSIS — Z0000 Encounter for general adult medical examination without abnormal findings: Secondary | ICD-10-CM | POA: Diagnosis not present

## 2018-03-28 DIAGNOSIS — K219 Gastro-esophageal reflux disease without esophagitis: Secondary | ICD-10-CM | POA: Diagnosis not present

## 2018-03-28 DIAGNOSIS — Z1389 Encounter for screening for other disorder: Secondary | ICD-10-CM | POA: Diagnosis not present

## 2018-03-28 DIAGNOSIS — I252 Old myocardial infarction: Secondary | ICD-10-CM | POA: Diagnosis not present

## 2018-03-28 DIAGNOSIS — E7800 Pure hypercholesterolemia, unspecified: Secondary | ICD-10-CM | POA: Diagnosis not present

## 2018-03-28 DIAGNOSIS — Z23 Encounter for immunization: Secondary | ICD-10-CM | POA: Diagnosis not present

## 2018-03-28 DIAGNOSIS — I10 Essential (primary) hypertension: Secondary | ICD-10-CM | POA: Diagnosis not present

## 2018-03-28 DIAGNOSIS — I2510 Atherosclerotic heart disease of native coronary artery without angina pectoris: Secondary | ICD-10-CM | POA: Diagnosis not present

## 2018-03-28 DIAGNOSIS — R0789 Other chest pain: Secondary | ICD-10-CM | POA: Diagnosis not present

## 2018-05-15 ENCOUNTER — Other Ambulatory Visit: Payer: Self-pay | Admitting: Physician Assistant

## 2018-05-16 ENCOUNTER — Other Ambulatory Visit: Payer: Self-pay | Admitting: Cardiovascular Disease

## 2018-05-17 NOTE — Telephone Encounter (Signed)
This is Dr. Croitoru's pt 

## 2018-05-18 MED ORDER — CLOPIDOGREL BISULFATE 75 MG PO TABS: ORAL_TABLET | ORAL | 1 refills | Status: DC

## 2018-05-18 NOTE — Telephone Encounter (Signed)
Rx request sent to pharmacy.  

## 2018-07-18 MED ORDER — LISINOPRIL 10 MG PO TABS: 10.0000 mg | ORAL_TABLET | Freq: Every day | ORAL | 2 refills | Status: DC

## 2018-08-03 ENCOUNTER — Other Ambulatory Visit: Payer: Self-pay | Admitting: Cardiovascular Disease

## 2018-08-05 ENCOUNTER — Other Ambulatory Visit: Payer: Self-pay | Admitting: Cardiovascular Disease

## 2018-08-19 ENCOUNTER — Ambulatory Visit (INDEPENDENT_AMBULATORY_CARE_PROVIDER_SITE_OTHER): Payer: Medicare Other | Admitting: Cardiovascular Disease

## 2018-08-19 ENCOUNTER — Encounter: Payer: Self-pay | Admitting: Cardiovascular Disease

## 2018-08-19 VITALS — BP 146/86 | HR 60 | Ht 69.0 in | Wt 156.0 lb

## 2018-08-19 DIAGNOSIS — E785 Hyperlipidemia, unspecified: Secondary | ICD-10-CM

## 2018-08-19 DIAGNOSIS — I2510 Atherosclerotic heart disease of native coronary artery without angina pectoris: Secondary | ICD-10-CM

## 2018-08-19 DIAGNOSIS — I10 Essential (primary) hypertension: Secondary | ICD-10-CM | POA: Diagnosis not present

## 2018-08-19 NOTE — Progress Notes (Signed)
Cardiology Office Note:    Date:  08/19/2018   ID:  Bobby PlanMichael J Sloss, DOB January 09, 1941, MRN [id number]  PCP:  Johny BlamerHarris, William, MD  Cardiologist:  Thurmon FairMihai Shelma Eiben, MD   Referring MD: Johny BlamerHarris, William, MD   Chief Complaint  Patient presents with  . Follow-up    12 months post PCI           History of Present Illness:    [name] is a 78 y.o. male with a hx of coronary artery disease presenting with a very small non-STEMI in January 2019, followed by two-vessel revascularization with placement of drug-eluting stents to the mid LAD artery (overlapping STENT SYNERGY DES 2.25X32 (2.4 mm) & STENT SYNERGY DES 2.75X16. (3.25 mm), with risk angioplasty of the second diagonal artery) and left circumflex coronary artery (STENT SYNERGY DES 2.5X12.).  Left ventricular systolic function was mildly depressed with an EF of 45% at presentation, but a repeat echocardiogram performed in April 2019 showed complete normalization of wall motion and overall EF.  He also has long-standing, but well treated hypertension.   He feels great.  He has no cardiovascular complaints.  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  He does have a nagging cough which appears to be ACE inhibitor related, but is not really bothering him too much.  He called in a few weeks ago with elevation in blood pressure and I recommended increasing his medication, but before he got around to making a change he noticed that his blood pressure 7 pounds.  It happened around Christmas and might've been related to increased sodium intake.  He had troublesome problems with edema when he took amlodipine.  He brings in a very detailed list of his blood pressure checked several times a day every day.  The typical reading is 120-130/70s.  The patient specifically denies any chest pain at rest exertion, dyspnea at rest or with exertion, orthopnea, paroxysmal nocturnal dyspnea, syncope,  palpitations, focal neurological deficits, intermittent claudication, lower extremity edema, unexplained weight gain,hemoptysis or wheezing.   Past Medical History:  Diagnosis Date  . Hyperlipidemia   . Hypertension   . NSTEMI (non-ST elevated myocardial infarction) (HCC) 08/16/2017   s/p PCI/DES to LAD x2, and Lcx x1. Normal EF    Past Surgical History:  Procedure Laterality Date  . CORONARY BALLOON ANGIOPLASTY N/A 08/17/2017   Procedure: CORONARY BALLOON ANGIOPLASTY;  Surgeon: Marykay LexHarding, David W, MD;  [facility];  Service: Cardiovascular;  Laterality: N/A;  . CORONARY STENT INTERVENTION N/A 08/17/2017   Procedure: CORONARY STENT INTERVENTION;  Surgeon: Marykay LexHarding, David W, MD;  [facility];  Service: Cardiovascular;  Laterality: N/A;  . LEFT HEART CATH AND CORONARY ANGIOGRAPHY N/A 08/17/2017   Procedure: LEFT HEART CATH AND CORONARY ANGIOGRAPHY;  Surgeon: Marykay LexHarding, David W, MD;  [facility];  Service: Cardiovascular;  Laterality: N/A;    Current Medications: Current Meds  Medication Sig  . aspirin EC 81 MG tablet Take 1 tablet (81 mg total) by mouth daily.  Marland Kitchen. atorvastatin (LIPITOR) 80 MG tablet TAKE 1 TABLET (80 MG TOTAL) BY MOUTH DAILY AT 6 PM.  . carvedilol (COREG) 6.25 MG tablet Take 1 tablet (6.25 mg total) by mouth 2 (two) times daily with a meal.  . lisinopril (PRINIVIL,ZESTRIL) 5 MG tablet Take 5 mg by mouth daily.  . nitroGLYCERIN (NITROSTAT) 0.4 MG SL tablet Place 1 tablet (0.4 mg total) under  the tongue every 5 (five) minutes x 3 doses as needed for chest pain.  . ranitidine (ZANTAC) 75 MG tablet Take 75 mg by mouth daily as needed for heartburn.  . traMADol (ULTRAM) 50 MG tablet Take 50 mg by mouth every 8 (eight) hours as needed (for pain).  . [DISCONTINUED] clopidogrel (PLAVIX) 75 MG tablet Take 1 tablet by mouth daily.     Allergies:   Orange fruit [citrus]; Penicillins; and Wine [alcohol]   Social History    Socioeconomic History  . Marital status: Married    Spouse name: Not on file  . Number of children: Not on file  . Years of education: Not on file  . Highest education level: Not on file  Occupational History  . Not on file  Social Needs  . Financial resource strain: Not on file  . Food insecurity:    Worry: Not on file    Inability: Not on file  . Transportation needs:    Medical: Not on file    Non-medical: Not on file  Tobacco Use  . Smoking status: Never Smoker  . Smokeless tobacco: Never Used  Substance and Sexual Activity  . Alcohol use: No    Frequency: Never  . Drug use: Never  . Sexual activity: Never  Lifestyle  . Physical activity:    Days per week: Not on file    Minutes per session: Not on file  . Stress: Not on file  Relationships  . Social connections:    Talks on phone: Not on file    Gets together: Not on file    Attends religious service: Not on file    Active member of club or organization: Not on file    Attends meetings of clubs or organizations: Not on file    Relationship status: Not on file  Other Topics Concern  . Not on file  Social History Narrative  . Not on file     Family History: The patient's family history is significantly negative for early onset coronary/vascular disease. ROS:   Please see the history of present illness.    All other systems are reviewed and are negative  EKGs/Labs/Other Studies Reviewed:    The following studies were reviewed today:  ECHO 11/12/2017 - Left ventricle: The cavity size was normal. Wall thickness was   normal. Systolic function was normal. The estimated ejection   fraction was in the range of 55% to 60%. Wall motion was normal;   there were no regional wall motion abnormalities. Left   ventricular diastolic function parameters were normal. - Mitral valve: There was mild regurgitation.  Cardiac catheterization August 17 2017  There is mild left ventricular systolic dysfunction. The left  ventricular ejection fraction is 45-50% by visual estimate.  LV end diastolic pressure is normal.  -------------------------------------------------  Mid Cx lesion is 99% stenosed.  A drug-eluting stent was successfully placed using a STENT SYNERGY DES 2.5X12.  Post intervention, there is a 0% residual stenosis.  _____________________________________________________________________  Mid LAD-1 lesion is 95% stenosed. Mid LAD-2 lesion is 90% stenosed. Mid LAD to Dist LAD lesion is 80% stenosed.  2 Overlapping drug-eluting stents were successfully placed using a STENT SYNERGY DES 2.25X32 (2.4 mm) & STENT SYNERGY DES 2.75X16. (3.25 mm)  Post intervention, there is a 0% residual stenosis.  _____________________________________________________________________  Suezanne Jacquet 2nd Diag lesion is 99% stenosed. (Bifurcation lesion - planned PTCA only)  Balloon angioplasty was performed using a BALLOON WOLVERINE 2.50X10. Post intervention, there is a 50% residual  stenosis.   Successful multisite PCI of the circumflex and LAD with PTCA of the D2. Mildly reduced EF with inferolateral hypokinesis.   EKG:  EKG is ordered today.  It shows sinus rhythm with subtle nonspecific ST changes most prominently seen in V5-V6  Recent Labs: 09/06/2017: BUN 18; Creatinine, Ser 1.01; Potassium 4.5; Sodium 137  Recent Lipid Panel    Component Value Date/Time   CHOL 167 08/17/2017 0407   TRIG 39 08/17/2017 0407   HDL 48 08/17/2017 0407   CHOLHDL 3.5 08/17/2017 0407   VLDL 8 08/17/2017 0407   LDLCALC 111 (H) 08/17/2017 0407     Physical Exam:    VS:  BP (!) 146/86   Pulse 60   Ht 5\' 9"  (1.753 m)   Wt 156 lb (70.8 kg)   BMI 23.04 kg/m     Wt Readings from Last 3 Encounters:  08/19/18 156 lb (70.8 kg)  12/03/17 154 lb (69.9 kg)  09/06/17 156 lb 3.2 oz (70.9 kg)     General: Alert, oriented x3, no distress, very lean and fit, looks much younger than stated age Head: no evidence of trauma, PERRL,  EOMI, no exophtalmos or lid lag, no myxedema, no xanthelasma; normal ears, nose and oropharynx Neck: normal jugular venous pulsations and no hepatojugular reflux; brisk carotid pulses without delay and no carotid bruits Chest: clear to auscultation, no signs of consolidation by percussion or palpation, normal fremitus, symmetrical and full respiratory excursions Cardiovascular: normal position and quality of the apical impulse, regular rhythm, normal first and second heart sounds, no murmurs, rubs or gallops Abdomen: no tenderness or distention, no masses by palpation, no abnormal pulsatility or arterial bruits, normal bowel sounds, no hepatosplenomegaly Extremities: no clubbing, cyanosis or edema; 2+ radial, ulnar and brachial pulses bilaterally; 2+ right femoral, posterior tibial and dorsalis pedis pulses; 2+ left femoral, posterior tibial and dorsalis pedis pulses; no subclavian or femoral bruits Neurological: grossly nonfocal Psych: Normal mood and affect   ASSESSMENT:    1. Coronary artery disease involving native coronary artery of native heart without angina pectoris   2. Dyslipidemia, goal LDL below 70   3. Essential hypertension    PLAN:    In order of problems listed above:  1. CAD: Remains asymptomatic despite a very active lifestyle.  12 months have passed and he can stop his clopidogrel. 2. HLP: Plan to recheck his lipid profile next week when he is fasting.  Target LDL less than 70. 3. HTN: Good blood pressure control.  Avoid amlodipine due to edema.  He has a cough with ACE inhibitor recommended switching to an ARB, but he rather not change medicines at this time since the cough does not bother him too much he is very pleased with his blood pressure levels.  Medication Adjustments/Labs and Tests Ordered: Current medicines are reviewed at length with the patient today.  Concerns regarding medicines are outlined above.  Orders Placed This Encounter  Procedures  . Lipid panel    . EKG 12-Lead   No orders of the defined types were placed in this encounter.   Patient Instructions  Medication Instructions:  STOP clopidogrel (plavix) Continue all other urrent medications If you need a refill on your cardiac medications before your next appointment, please call your pharmacy.   Lab work: FASTING lab work to check cholesterol If you have labs (blood work) drawn today and your tests are completely normal, you will receive your results only by: Marland Kitchen MyChart Message (if you have MyChart)  OR . A paper copy in the mail If you have any lab test that is abnormal or we need to change your treatment, we will call you to review the results.  Follow-Up: At [facility], you and your health needs are our priority.  As part of our continuing mission to provide you with exceptional heart care, we have created designated Provider Care Teams.  These Care Teams include your primary Cardiologist (physician) and Advanced Practice Providers (APPs -  Physician Assistants and Nurse Practitioners) who all work together to provide you with the care you need, when you need it. You will need a follow up appointment in 12 months.  Please call our office 2 months in advance to schedule this appointment.  You may see Thurmon Fair, MD or one of the following Advanced Practice Providers on your designated Care Team: [place], [place] . Micah Flesher, PA-C  Any Other Special Instructions Will Be Listed Below (If Applicable).        Signed, Thurmon Fair, MD  08/19/2018 1:22 PM    [facility]

## 2018-08-19 NOTE — Patient Instructions (Addendum)
Medication Instructions:  STOP clopidogrel (plavix) Continue all other urrent medications If you need a refill on your cardiac medications before your next appointment, please call your pharmacy.   Lab work: FASTING lab work to check cholesterol If you have labs (blood work) drawn today and your tests are completely normal, you will receive your results only by: Marland Kitchen MyChart Message (if you have MyChart) OR . A paper copy in the mail If you have any lab test that is abnormal or we need to change your treatment, we will call you to review the results.  Follow-Up: At [facility], you and your health needs are our priority.  As part of our continuing mission to provide you with exceptional heart care, we have created designated Provider Care Teams.  These Care Teams include your primary Cardiologist (physician) and Advanced Practice Providers (APPs -  Physician Assistants and Nurse Practitioners) who all work together to provide you with the care you need, when you need it. You will need a follow up appointment in 12 months.  Please call our office 2 months in advance to schedule this appointment.  You may see Thurmon Fair, MD or one of the following Advanced Practice Providers on your designated Care Team: [place], [place] . Micah Flesher, PA-C  Any Other Special Instructions Will Be Listed Below (If Applicable).

## 2018-08-22 DIAGNOSIS — E785 Hyperlipidemia, unspecified: Secondary | ICD-10-CM | POA: Diagnosis not present

## 2018-08-22 LAB — LIPID PANEL
Chol/HDL Ratio: 2.1 ratio (ref 0.0–5.0)
Cholesterol, Total: 116 mg/dL (ref 100–199)
HDL: 54 mg/dL
LDL Calculated: 51 mg/dL (ref 0–99)
Triglycerides: 55 mg/dL (ref 0–149)
VLDL Cholesterol Cal: 11 mg/dL (ref 5–40)

## 2018-09-06 DIAGNOSIS — I10 Essential (primary) hypertension: Secondary | ICD-10-CM | POA: Diagnosis not present

## 2018-09-06 DIAGNOSIS — E7800 Pure hypercholesterolemia, unspecified: Secondary | ICD-10-CM | POA: Diagnosis not present

## 2018-09-06 DIAGNOSIS — I252 Old myocardial infarction: Secondary | ICD-10-CM | POA: Diagnosis not present

## 2018-09-06 DIAGNOSIS — K219 Gastro-esophageal reflux disease without esophagitis: Secondary | ICD-10-CM | POA: Diagnosis not present

## 2018-09-06 DIAGNOSIS — I2510 Atherosclerotic heart disease of native coronary artery without angina pectoris: Secondary | ICD-10-CM | POA: Diagnosis not present

## 2018-10-27 ENCOUNTER — Other Ambulatory Visit: Payer: Self-pay | Admitting: Cardiovascular Disease

## 2018-10-27 ENCOUNTER — Other Ambulatory Visit: Payer: Self-pay

## 2018-10-27 MED ORDER — LISINOPRIL 5 MG PO TABS: 5.0000 mg | ORAL_TABLET | Freq: Every day | ORAL | 3 refills | Status: DC

## 2018-10-27 MED ORDER — CARVEDILOL 6.25 MG PO TABS: 6.2500 mg | ORAL_TABLET | Freq: Two times a day (BID) | ORAL | 3 refills | Status: DC

## 2019-03-30 DIAGNOSIS — I252 Old myocardial infarction: Secondary | ICD-10-CM | POA: Diagnosis not present

## 2019-03-30 DIAGNOSIS — Z1389 Encounter for screening for other disorder: Secondary | ICD-10-CM | POA: Diagnosis not present

## 2019-03-30 DIAGNOSIS — I10 Essential (primary) hypertension: Secondary | ICD-10-CM | POA: Diagnosis not present

## 2019-03-30 DIAGNOSIS — Z0000 Encounter for general adult medical examination without abnormal findings: Secondary | ICD-10-CM | POA: Diagnosis not present

## 2019-03-30 DIAGNOSIS — E7800 Pure hypercholesterolemia, unspecified: Secondary | ICD-10-CM | POA: Diagnosis not present

## 2019-03-30 DIAGNOSIS — I2510 Atherosclerotic heart disease of native coronary artery without angina pectoris: Secondary | ICD-10-CM | POA: Diagnosis not present

## 2019-03-30 DIAGNOSIS — Z23 Encounter for immunization: Secondary | ICD-10-CM | POA: Diagnosis not present

## 2019-03-30 DIAGNOSIS — K219 Gastro-esophageal reflux disease without esophagitis: Secondary | ICD-10-CM | POA: Diagnosis not present

## 2019-05-19 ENCOUNTER — Other Ambulatory Visit: Payer: Self-pay | Admitting: *Deleted

## 2019-05-19 DIAGNOSIS — I10 Essential (primary) hypertension: Secondary | ICD-10-CM

## 2019-05-24 ENCOUNTER — Other Ambulatory Visit: Payer: Self-pay

## 2019-05-24 DIAGNOSIS — I10 Essential (primary) hypertension: Secondary | ICD-10-CM | POA: Diagnosis not present

## 2019-05-24 LAB — BASIC METABOLIC PANEL
BUN/Creatinine Ratio: 15 (ref 10–24)
BUN: 15 mg/dL (ref 8–27)
CO2: 28 mmol/L (ref 20–29)
Calcium: 9 mg/dL (ref 8.6–10.2)
Chloride: 96 mmol/L (ref 96–106)
Creatinine, Ser: 1.02 mg/dL (ref 0.76–1.27)
GFR calc Af Amer: 80 mL/min/{1.73_m2} (ref >59)
GFR calc non Af Amer: 69 mL/min/{1.73_m2} (ref >59)
Glucose: 140 mg/dL — ABNORMAL HIGH (ref 65–99)
Potassium: 4.9 mmol/L (ref 3.5–5.2)
Sodium: 135 mmol/L (ref 134–144)

## 2019-05-31 DIAGNOSIS — I252 Old myocardial infarction: Secondary | ICD-10-CM | POA: Diagnosis not present

## 2019-05-31 DIAGNOSIS — I10 Essential (primary) hypertension: Secondary | ICD-10-CM | POA: Diagnosis not present

## 2019-05-31 DIAGNOSIS — I2510 Atherosclerotic heart disease of native coronary artery without angina pectoris: Secondary | ICD-10-CM | POA: Diagnosis not present

## 2019-05-31 DIAGNOSIS — E7800 Pure hypercholesterolemia, unspecified: Secondary | ICD-10-CM | POA: Diagnosis not present

## 2019-08-12 ENCOUNTER — Ambulatory Visit: Payer: Medicare Other | Attending: Internal Medicine

## 2019-08-12 DIAGNOSIS — Z23 Encounter for immunization: Secondary | ICD-10-CM | POA: Insufficient documentation

## 2019-08-12 NOTE — Progress Notes (Signed)
   Covid-19 Vaccination Clinic  Name:  [name]    MRN: [id number] DOB: 1941-02-11  08/12/2019  [name] was observed post Covid-19 immunization for 15 minutes without incidence. He was provided with Vaccine Information Sheet and instruction to access the V-Safe system.   [name] was instructed to call 911 with any severe reactions post vaccine: Marland Kitchen Difficulty breathing  . Swelling of your face and throat  . A fast heartbeat  . A bad rash all over your body  . Dizziness and weakness    Immunizations Administered    Name Date Dose VIS Date Route   Pfizer COVID-19 Vaccine 08/12/2019 12:04 PM 0.3 mL 06/30/2019 Intramuscular   Manufacturer: [employer], [employer]   Lot: [id number]   NDC: [address]-0

## 2019-08-30 ENCOUNTER — Ambulatory Visit (INDEPENDENT_AMBULATORY_CARE_PROVIDER_SITE_OTHER): Payer: Medicare Other | Admitting: Cardiovascular Disease

## 2019-08-30 ENCOUNTER — Encounter: Payer: Self-pay | Admitting: Cardiovascular Disease

## 2019-08-30 ENCOUNTER — Other Ambulatory Visit: Payer: Self-pay

## 2019-08-30 VITALS — BP 130/72 | HR 55 | Ht 69.0 in | Wt 158.0 lb

## 2019-08-30 DIAGNOSIS — R0602 Shortness of breath: Secondary | ICD-10-CM | POA: Diagnosis not present

## 2019-08-30 DIAGNOSIS — I2510 Atherosclerotic heart disease of native coronary artery without angina pectoris: Secondary | ICD-10-CM

## 2019-08-30 DIAGNOSIS — I10 Essential (primary) hypertension: Secondary | ICD-10-CM

## 2019-08-30 DIAGNOSIS — E7800 Pure hypercholesterolemia, unspecified: Secondary | ICD-10-CM

## 2019-08-30 LAB — COMPREHENSIVE METABOLIC PANEL
ALT: 26 IU/L (ref 0–44)
AST: 24 IU/L (ref 0–40)
Albumin/Globulin Ratio: 1.6 (ref 1.2–2.2)
Albumin: 4.6 g/dL (ref 3.7–4.7)
Alkaline Phosphatase: 101 IU/L (ref 39–117)
BUN/Creatinine Ratio: 17 (ref 10–24)
BUN: 15 mg/dL (ref 8–27)
Bilirubin Total: 1 mg/dL (ref 0.0–1.2)
CO2: 27 mmol/L (ref 20–29)
Calcium: 9.4 mg/dL (ref 8.6–10.2)
Chloride: 95 mmol/L — ABNORMAL LOW (ref 96–106)
Creatinine, Ser: 0.87 mg/dL (ref 0.76–1.27)
GFR calc Af Amer: 94 mL/min/{1.73_m2} (ref >59)
GFR calc non Af Amer: 82 mL/min/{1.73_m2} (ref >59)
Globulin, Total: 2.8 g/dL (ref 1.5–4.5)
Glucose: 84 mg/dL (ref 65–99)
Potassium: 5.4 mmol/L — ABNORMAL HIGH (ref 3.5–5.2)
Sodium: 135 mmol/L (ref 134–144)
Total Protein: 7.4 g/dL (ref 6.0–8.5)

## 2019-08-30 LAB — LIPID PANEL
Chol/HDL Ratio: 2.1 ratio (ref 0.0–5.0)
Cholesterol, Total: 117 mg/dL (ref 100–199)
HDL: 56 mg/dL (ref >39)
LDL Chol Calc (NIH): 49 mg/dL (ref 0–99)
Triglycerides: 51 mg/dL (ref 0–149)
VLDL Cholesterol Cal: 12 mg/dL (ref 5–40)

## 2019-08-30 NOTE — Patient Instructions (Signed)
Medication Instructions:  Your physician has recommended you make the following change in your medication:   STOP TAKING YOUR LISINOPRIL   *If you need a refill on your cardiac medications before your next appointment, please call your pharmacy*  Lab Work: Your physician recommends that you return for lab work TODAY: FASTING LIPID PANEL COMPREHENSIVE METABOLIC PANEL  If you have labs (blood work) drawn today and your tests are completely normal, you will receive your results only by: Marland Kitchen MyChart Message (if you have MyChart) OR . A paper copy in the mail If you have any lab test that is abnormal or we need to change your treatment, we will call you to review the results.  Testing/Procedures: Your physician has requested that you have an echocardiogram. Echocardiography is a painless test that uses sound waves to create images of your heart. It provides your doctor with information about the size and shape of your heart and how well your heart's chambers and valves are working. This procedure takes approximately one hour. There are no restrictions for this procedure.  LOCATION: [facility] at [facility]: [address], [place], [place] [address]  TO BE SCHEDULED  Follow-Up: At [facility], you and your health needs are our priority.  As part of our continuing mission to provide you with exceptional heart care, we have created designated Provider Care Teams.  These Care Teams include your primary Cardiologist (physician) and Advanced Practice Providers (APPs -  Physician Assistants and Nurse Practitioners) who all work together to provide you with the care you need, when you need it.  Your next appointment:   12 month(s)  The format for your next appointment:   In Person  Provider:   Thurmon Fair, MD

## 2019-08-30 NOTE — Progress Notes (Signed)
Cardiology Office Note:    Date:  08/30/2019   ID:  [name], DOB [birth year], MRN [id number]  PCP:  Tally Joe, MD  Cardiologist:  Thurmon Fair, MD   Referring MD: [name], MD   Chief Complaint  Patient presents with  . Coronary Artery Disease  . Cough     History of Present Illness:    [name] is a 79 y.o. male with a hx of coronary artery disease presenting with a very small non-STEMI in January 2019, followed by two-vessel revascularization with placement of drug-eluting stents to the mid LAD artery (overlapping STENT SYNERGY DES 2.25X32 (2.4 mm) & STENT SYNERGY DES 2.75X16. (3.25 mm), with risk angioplasty of the second diagonal artery) and left circumflex coronary artery (STENT SYNERGY DES 2.5X12.).  Left ventricular systolic function was mildly depressed with an EF of 45% at presentation, but a repeat echocardiogram performed in April 2019 showed complete normalization of wall motion and overall EF.  He also has long-standing, but well treated hypertension.   He remains very physically active.  He walks at least a mile a day on hilly, unpaved terrain 6 or 7 days a week.  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  However, if he has to "rush" he becomes short of breath even after just several yards.  She occasionally has midsternal chest discomfort when sitting at the computer, especially if he concentrates intensely on what he is doing.  If he straightens himself out, and lays on the bed the symptoms resolve.  The angina that he experienced with his heart attack was a "heartburn" sensation with radiation to his right shoulder, very different from discomfort.  He has a daily cough productive of very thick sputum.  He does not have wheezing.  He quit smoking when he was 79 years old.  He keeps very detailed records of his blood pressure very consistently in normal range, usually 110-120/60-70.  The patient specifically denies  orthopnea, paroxysmal nocturnal dyspnea, syncope, palpitations, focal neurological deficits, intermittent claudication, lower extremity edema, unexplained weight gain,hemoptysis or wheezing.  He has occasional "tingling" that goes down both legs while he is sitting in a chair, followed by a sensation of warmth, does not occur when he is walking.   Past Medical History:  Diagnosis Date  . Hyperlipidemia   . Hypertension   . NSTEMI (non-ST elevated myocardial infarction) (HCC) 08/16/2017   s/p PCI/DES to LAD x2, and Lcx x1. Normal EF    Past Surgical History:  Procedure Laterality Date  . CORONARY BALLOON ANGIOPLASTY N/A 08/17/2017   Procedure: CORONARY BALLOON ANGIOPLASTY;  Surgeon: [name], MD;  Location: [facility] INVASIVE CV LAB;  Service: Cardiovascular;  Laterality: N/A;  . CORONARY STENT INTERVENTION N/A 08/17/2017   Procedure: CORONARY STENT INTERVENTION;  Surgeon: [name], MD;  Location: [facility] INVASIVE CV LAB;  Service: Cardiovascular;  Laterality: N/A;  . LEFT HEART CATH AND CORONARY ANGIOGRAPHY N/A 08/17/2017   Procedure: LEFT HEART CATH AND CORONARY ANGIOGRAPHY;  Surgeon: [name], MD;  Location: [facility] INVASIVE CV LAB;  Service: Cardiovascular;  Laterality: N/A;    Current Medications: Current Meds  Medication Sig  . aspirin EC 81 MG tablet Take 1 tablet (81 mg total) by mouth daily.  Marland Kitchen atorvastatin (LIPITOR) 80 MG tablet TAKE 1 TABLET (80 MG TOTAL) BY MOUTH DAILY AT 6 PM.  . carvedilol (COREG) 6.25 MG tablet Take 1 tablet (6.25 mg total) by mouth 2 (two)  times daily with a meal.  . nitroGLYCERIN (NITROSTAT) 0.4 MG SL tablet Place 1 tablet (0.4 mg total) under the tongue every 5 (five) minutes x 3 doses as needed for chest pain.  . traMADol (ULTRAM) 50 MG tablet Take 50 mg by mouth every 8 (eight) hours as needed (for pain).  . [DISCONTINUED] lisinopril (PRINIVIL,ZESTRIL) 5 MG tablet Take 1 tablet (5 mg total) by mouth daily.     Allergies:   Orange fruit [citrus],  Penicillins, and Wine [alcohol]   Social History   Socioeconomic History  . Marital status: Married    Spouse name: Not on file  . Number of children: Not on file  . Years of education: Not on file  . Highest education level: Not on file  Occupational History  . Not on file  Tobacco Use  . Smoking status: Never Smoker  . Smokeless tobacco: Never Used  Substance and Sexual Activity  . Alcohol use: No  . Drug use: Never  . Sexual activity: Never  Other Topics Concern  . Not on file  Social History Narrative  . Not on file   Social Determinants of Health   Financial Resource Strain:   . Difficulty of Paying Living Expenses: Not on file  Food Insecurity:   . Worried About Charity fundraiser in the Last Year: Not on file  . Ran Out of Food in the Last Year: Not on file  Transportation Needs:   . Lack of Transportation (Medical): Not on file  . Lack of Transportation (Non-Medical): Not on file  Physical Activity:   . Days of Exercise per Week: Not on file  . Minutes of Exercise per Session: Not on file  Stress:   . Feeling of Stress : Not on file  Social Connections:   . Frequency of Communication with Friends and Family: Not on file  . Frequency of Social Gatherings with Friends and Family: Not on file  . Attends Religious Services: Not on file  . Active Member of Clubs or Organizations: Not on file  . Attends Archivist Meetings: Not on file  . Marital Status: Not on file     Family History: The patient's family history is significantly negative for early onset coronary/vascular disease. ROS:   Please see the history of present illness.    All other systems are reviewed and are negative.   EKGs/Labs/Other Studies Reviewed:    The following studies were reviewed today:  ECHO 11/12/2017 - Left ventricle: The cavity size was normal. Wall thickness was   normal. Systolic function was normal. The estimated ejection   fraction was in the range of 55% to  60%. Wall motion was normal;   there were no regional wall motion abnormalities. Left   ventricular diastolic function parameters were normal. - Mitral valve: There was mild regurgitation.  Cardiac catheterization August 17 2017  There is mild left ventricular systolic dysfunction. The left ventricular ejection fraction is 45-50% by visual estimate.  LV end diastolic pressure is normal.  -------------------------------------------------  Mid Cx lesion is 99% stenosed.  A drug-eluting stent was successfully placed using a STENT SYNERGY DES 2.5X12.  Post intervention, there is a 0% residual stenosis.  _____________________________________________________________________  Mid LAD-1 lesion is 95% stenosed. Mid LAD-2 lesion is 90% stenosed. Mid LAD to Dist LAD lesion is 80% stenosed.  2 Overlapping drug-eluting stents were successfully placed using a STENT SYNERGY DES 2.25X32 (2.4 mm) & STENT SYNERGY DES 2.75X16. (3.25 mm)  Post intervention, there  is a 0% residual stenosis.  _____________________________________________________________________  Suezanne Jacquet 2nd Diag lesion is 99% stenosed. (Bifurcation lesion - planned PTCA only)  Balloon angioplasty was performed using a BALLOON WOLVERINE 2.50X10. Post intervention, there is a 50% residual stenosis.   Successful multisite PCI of the circumflex and LAD with PTCA of the D2. Mildly reduced EF with inferolateral hypokinesis.   EKG:  EKG is ordered today.  It shows sinus rhythm with subtle nonspecific ST changes most prominently seen in V5-V6  Recent Labs: 05/24/2019: BUN 15; Creatinine, Ser 1.02; Potassium 4.9; Sodium 135  Recent Lipid Panel    Component Value Date/Time   CHOL 116 08/22/2018 0828   TRIG 55 08/22/2018 0828   HDL 54 08/22/2018 0828   CHOLHDL 2.1 08/22/2018 0828   CHOLHDL 3.5 08/17/2017 0407   VLDL 8 08/17/2017 0407   LDLCALC 51 08/22/2018 0828     Physical Exam:    VS:  BP 130/72   Pulse (!) 55   Ht 5\' 9"   (1.753 m)   Wt 158 lb (71.7 kg)   BMI 23.33 kg/m     Wt Readings from Last 3 Encounters:  08/30/19 158 lb (71.7 kg)  08/19/18 156 lb (70.8 kg)  12/03/17 154 lb (69.9 kg)     General: Alert, oriented x3, no distress, lean, fit, appears younger than stated age Head: no evidence of trauma, PERRL, EOMI, no exophtalmos or lid lag, no myxedema, no xanthelasma; normal ears, nose and oropharynx Neck: normal jugular venous pulsations and no hepatojugular reflux; brisk carotid pulses without delay and no carotid bruits Chest: clear to auscultation, no signs of consolidation by percussion or palpation, normal fremitus, symmetrical and full respiratory excursions Cardiovascular: normal position and quality of the apical impulse, regular rhythm, normal first and second heart sounds, no murmurs, rubs or gallops Abdomen: no tenderness or distention, no masses by palpation, no abnormal pulsatility or arterial bruits, normal bowel sounds, no hepatosplenomegaly Extremities: no clubbing, cyanosis or edema; 2+ radial, ulnar and brachial pulses bilaterally; 2+ right femoral, posterior tibial and dorsalis pedis pulses; 2+ left femoral, posterior tibial and dorsalis pedis pulses; no subclavian or femoral bruits Neurological: grossly nonfocal Psych: Normal mood and affect   ASSESSMENT:    1. Coronary artery disease involving native coronary artery of native heart without angina pectoris   2. SOB (shortness of breath)   3. Hypercholesterolemia   4. Essential hypertension    PLAN:    In order of problems listed above:  1. CAD: Very active without angina.  Continue aspirin and statin and beta-blocker. 2. Shortness of breath on exertion: No other signs to suggest congestive heart failure.  We will recheck his echocardiogram.  Consider switching from carvedilol to a more selective beta-blocker if we do not identify a reason for his cough and shortness of breath.  I did not hear any wheezing on exam today and he  does not report wheezing. 3. HLP: Recheck fasting lipid profile today. 4. HTN: Excellent blood pressure control.  Avoid amlodipine due to edema.  We will stop his ACE inhibitor since it might be contributing to his cough, although I wonder whether he has allergic or inflammatory reasons for his cough due to the increased mucus production.  Medication Adjustments/Labs and Tests Ordered: Current medicines are reviewed at length with the patient today.  Concerns regarding medicines are outlined above.  Orders Placed This Encounter  Procedures  . Lipid panel  . Comprehensive metabolic panel  . EKG 12-Lead  . ECHOCARDIOGRAM COMPLETE  No orders of the defined types were placed in this encounter.   Patient Instructions  Medication Instructions:  Your physician has recommended you make the following change in your medication:   STOP TAKING YOUR LISINOPRIL   *If you need a refill on your cardiac medications before your next appointment, please call your pharmacy*  Lab Work: Your physician recommends that you return for lab work TODAY: FASTING LIPID PANEL COMPREHENSIVE METABOLIC PANEL  If you have labs (blood work) drawn today and your tests are completely normal, you will receive your results only by: Marland Kitchen MyChart Message (if you have MyChart) OR . A paper copy in the mail If you have any lab test that is abnormal or we need to change your treatment, we will call you to review the results.  Testing/Procedures: Your physician has requested that you have an echocardiogram. Echocardiography is a painless test that uses sound waves to create images of your heart. It provides your doctor with information about the size and shape of your heart and how well your heart's chambers and valves are working. This procedure takes approximately one hour. There are no restrictions for this procedure.  LOCATION: [facility] at [facility]: [address], [place], [place]  [address]  TO BE SCHEDULED  Follow-Up: At [facility], you and your health needs are our priority.  As part of our continuing mission to provide you with exceptional heart care, we have created designated Provider Care Teams.  These Care Teams include your primary Cardiologist (physician) and Advanced Practice Providers (APPs -  Physician Assistants and Nurse Practitioners) who all work together to provide you with the care you need, when you need it.  Your next appointment:   12 month(s)  The format for your next appointment:   In Person  Provider:   Thurmon Fair, MD       Signed, Thurmon Fair, MD  08/30/2019 10:02 AM    [facility]

## 2019-08-31 ENCOUNTER — Ambulatory Visit: Payer: Medicare Other | Attending: Internal Medicine

## 2019-08-31 DIAGNOSIS — Z23 Encounter for immunization: Secondary | ICD-10-CM

## 2019-08-31 NOTE — Progress Notes (Signed)
   Covid-19 Vaccination Clinic  Name:  [name]    MRN: [id number] DOB: Jun 05, 1941  08/31/2019  [name] was observed post Covid-19 immunization for 15 minutes without incidence. He was provided with Vaccine Information Sheet and instruction to access the V-Safe system.   [name] was instructed to call 911 with any severe reactions post vaccine: Marland Kitchen Difficulty breathing  . Swelling of your face and throat  . A fast heartbeat  . A bad rash all over your body  . Dizziness and weakness    Immunizations Administered    Name Date Dose VIS Date Route   Pfizer COVID-19 Vaccine 08/31/2019  1:59 PM 0.3 mL 06/30/2019 Intramuscular   Manufacturer: [employer], [employer]   Lot: [id number]   NDC: [address]-1

## 2019-09-02 ENCOUNTER — Ambulatory Visit: Payer: Self-pay

## 2019-09-05 DIAGNOSIS — I10 Essential (primary) hypertension: Secondary | ICD-10-CM | POA: Diagnosis not present

## 2019-09-05 DIAGNOSIS — I252 Old myocardial infarction: Secondary | ICD-10-CM | POA: Diagnosis not present

## 2019-09-05 DIAGNOSIS — E7800 Pure hypercholesterolemia, unspecified: Secondary | ICD-10-CM | POA: Diagnosis not present

## 2019-09-05 DIAGNOSIS — I2510 Atherosclerotic heart disease of native coronary artery without angina pectoris: Secondary | ICD-10-CM | POA: Diagnosis not present

## 2019-09-13 ENCOUNTER — Other Ambulatory Visit: Payer: Self-pay

## 2019-09-13 ENCOUNTER — Ambulatory Visit (HOSPITAL_COMMUNITY): Payer: Medicare Other | Attending: Cardiology

## 2019-09-13 DIAGNOSIS — I252 Old myocardial infarction: Secondary | ICD-10-CM | POA: Insufficient documentation

## 2019-09-13 DIAGNOSIS — E785 Hyperlipidemia, unspecified: Secondary | ICD-10-CM | POA: Insufficient documentation

## 2019-09-13 DIAGNOSIS — Z87891 Personal history of nicotine dependence: Secondary | ICD-10-CM | POA: Diagnosis not present

## 2019-09-13 DIAGNOSIS — R0602 Shortness of breath: Secondary | ICD-10-CM

## 2019-09-13 DIAGNOSIS — I2510 Atherosclerotic heart disease of native coronary artery without angina pectoris: Secondary | ICD-10-CM | POA: Diagnosis not present

## 2019-09-14 ENCOUNTER — Telehealth: Payer: Self-pay | Admitting: *Deleted

## 2019-09-14 MED ORDER — METOPROLOL SUCCINATE ER 25 MG PO TB24: 25.0000 mg | ORAL_TABLET | Freq: Every day | ORAL | 3 refills | Status: DC

## 2019-09-14 NOTE — Telephone Encounter (Signed)
-----   Message from Thurmon Fair, MD sent at 09/13/2019  1:29 PM EST ----- Echo looks really good, no reason to suspect there is any heart related reason to be short of breath.  Would like to see if switching him to a different beta-blocker would help.  Stop carvedilol.  Start metoprolol succinate 25 mg once daily, please.

## 2019-09-14 NOTE — Telephone Encounter (Signed)
Patient made aware of results and verbalized understanding.  Metoprolol Succinate 25 mg once daily has been sent in for him.

## 2019-09-15 ENCOUNTER — Ambulatory Visit: Payer: Medicare Other

## 2019-10-10 MED ORDER — HYDROCHLOROTHIAZIDE 12.5 MG PO TABS: 12.5000 mg | ORAL_TABLET | Freq: Every day | ORAL | 3 refills | Status: DC

## 2019-10-10 NOTE — Telephone Encounter (Signed)
Pt informed of providers result & recommendations. Pt verbalized understanding. New HCTZ 12.5mg  rx sent to requested pharmacy. Pt requested this message be also sent to him via mychart-done

## 2019-10-12 ENCOUNTER — Other Ambulatory Visit: Payer: Self-pay | Admitting: Cardiovascular Disease

## 2019-10-14 ENCOUNTER — Other Ambulatory Visit: Payer: Self-pay | Admitting: *Deleted

## 2019-10-15 ENCOUNTER — Other Ambulatory Visit: Payer: Self-pay | Admitting: *Deleted

## 2019-11-02 ENCOUNTER — Emergency Department (HOSPITAL_COMMUNITY): Payer: Medicare Other

## 2019-11-02 ENCOUNTER — Emergency Department (HOSPITAL_COMMUNITY)
Admission: EM | Admit: 2019-11-02 | Discharge: 2019-11-02 | Disposition: A | Discharge status: Home / Self Care | Payer: Medicare Other | Attending: Emergency Medicine | Admitting: Emergency Medicine

## 2019-11-02 ENCOUNTER — Other Ambulatory Visit: Payer: Self-pay

## 2019-11-02 ENCOUNTER — Encounter (HOSPITAL_COMMUNITY): Payer: Self-pay

## 2019-11-02 DIAGNOSIS — W312XXA Contact with powered woodworking and forming machines, initial encounter: Secondary | ICD-10-CM | POA: Diagnosis not present

## 2019-11-02 DIAGNOSIS — Y929 Unspecified place or not applicable: Secondary | ICD-10-CM | POA: Diagnosis not present

## 2019-11-02 DIAGNOSIS — Y939 Activity, unspecified: Secondary | ICD-10-CM | POA: Insufficient documentation

## 2019-11-02 DIAGNOSIS — S51811A Laceration without foreign body of right forearm, initial encounter: Secondary | ICD-10-CM | POA: Diagnosis not present

## 2019-11-02 DIAGNOSIS — Z7982 Long term (current) use of aspirin: Secondary | ICD-10-CM | POA: Insufficient documentation

## 2019-11-02 DIAGNOSIS — Z79899 Other long term (current) drug therapy: Secondary | ICD-10-CM | POA: Insufficient documentation

## 2019-11-02 DIAGNOSIS — Y999 Unspecified external cause status: Secondary | ICD-10-CM | POA: Insufficient documentation

## 2019-11-02 MED ORDER — CEPHALEXIN 250 MG PO CAPS
500.0000 mg | ORAL_CAPSULE | Freq: Once | ORAL | Status: AC
  Administered 2019-11-02: 500 mg via ORAL
  Filled 2019-11-02: qty 2

## 2019-11-02 MED ORDER — CEPHALEXIN 500 MG PO CAPS: 500.0000 mg | ORAL_CAPSULE | Freq: Three times a day (TID) | ORAL | 0 refills | Status: AC

## 2019-11-02 MED ORDER — LIDOCAINE-EPINEPHRINE (PF) 2 %-1:200000 IJ SOLN
10.0000 mL | Freq: Once | INTRAMUSCULAR | Status: AC
  Administered 2019-11-02: 10 mL
  Filled 2019-11-02: qty 20

## 2019-11-02 MED ORDER — BUPIVACAINE HCL (PF) 0.5 % IJ SOLN
10.0000 mL | Freq: Once | INTRAMUSCULAR | Status: AC
  Administered 2019-11-02: 20:00:00 10 mL
  Filled 2019-11-02: qty 10

## 2019-11-02 NOTE — Discharge Instructions (Addendum)
  Wound Care - Laceration You may remove the bandage after 24 hours. Clean the wound and surrounding area gently with tap water and mild soap. Rinse well and blot dry. Do not scrub the wound, as this may cause the wound edges to come apart. You may shower, but avoid submerging the wound, such as with a bath or swimming. Clean the wound daily to prevent infection. Do not use cleaners such as hydrogen peroxide or alcohol.   Please take all of your antibiotics until finished!   You may develop abdominal discomfort or diarrhea from the antibiotic.  You may help offset this with probiotics which you can buy or get in yogurt. Do not eat or take the probiotics until 2 hours after your antibiotic.   Scar reduction: Application of a topical antibiotic ointment, such as Neosporin, after the wound has begun to close and heal well can decrease scab formation and reduce scarring. After the wound has healed and wound closures have been removed, application of ointments such as Aquaphor can also reduce scar formation.  The key to scar reduction is keeping the skin well hydrated and supple. Drinking plenty of water throughout the day (At least eight 8oz glasses of water a day) is essential to staying well hydrated.  Sun exposure: Keep the wound out of the sun. After the wound has healed, continue to protect it from the sun by wearing protective clothing or applying sunscreen.  Pain: You may use Tylenol for pain.  Recheck: Have the wound and neurologic status of the hand rechecked within the next several days.  Suture/staple removal: Return to the ED in 9 days for suture removal.  Return to the ED sooner should the wound edges come apart or signs of infection arise, such as spreading redness, puffiness/swelling, pus draining from the wound, severe increase in pain, fever over 100.52F, or any other major issues.  For prescription assistance, may try using prescription discount sites or apps, such as goodrx.com

## 2019-11-02 NOTE — ED Provider Notes (Signed)
[name] EMERGENCY DEPARTMENT Provider Note   CSN: [id number] Arrival date & time: 11/02/19  1358     History Chief Complaint  Patient presents with  . Extremity Laceration    [name] is a 79 y.o. male.  HPI      [name] is a 79 y.o. male, with a history of hyperlipidemia, HTN, NSTEMI, presenting to the ED with laceration to the right arm that occurred around 1 PM today.  Patient was using a handheld grinder with a grinding wheel attached.  He sat the device down, he states for some reason it kept running, and he accidentally hit his right forearm against the wheel, causing laceration. Pain 2/10, soreness, nonradiating.  He endorses a feeling of decreased sensation along the radial aspect of the right wrist. Tetanus up to date a few years ago.  Denies weakness or other injuries.  Past Medical History:  Diagnosis Date  . Hyperlipidemia   . Hypertension   . NSTEMI (non-ST elevated myocardial infarction) (HCC) 08/16/2017   s/p PCI/DES to LAD x2, and Lcx x1. Normal EF    Patient Active Problem List   Diagnosis Date Noted  . Coronary artery disease involving native coronary artery of native heart without angina pectoris 12/03/2017  . Essential hypertension   . Dyslipidemia, goal LDL below 70 08/18/2017  . Accelerated hypertension   . NSTEMI (non-ST elevated myocardial infarction) (HCC) 08/16/2017    Past Surgical History:  Procedure Laterality Date  . CORONARY BALLOON ANGIOPLASTY N/A 08/17/2017   Procedure: CORONARY BALLOON ANGIOPLASTY;  Surgeon: [name], MD;  Location: [facility] INVASIVE CV LAB;  Service: Cardiovascular;  Laterality: N/A;  . CORONARY STENT INTERVENTION N/A 08/17/2017   Procedure: CORONARY STENT INTERVENTION;  Surgeon: [name], MD;  Location: [facility] INVASIVE CV LAB;  Service: Cardiovascular;  Laterality: N/A;  . LEFT HEART CATH AND CORONARY ANGIOGRAPHY N/A 08/17/2017   Procedure: LEFT HEART CATH AND CORONARY  ANGIOGRAPHY;  Surgeon: [name], MD;  Location: [facility] INVASIVE CV LAB;  Service: Cardiovascular;  Laterality: N/A;       History reviewed. No pertinent family history.  Social History   Tobacco Use  . Smoking status: Never Smoker  . Smokeless tobacco: Never Used  Substance Use Topics  . Alcohol use: No  . Drug use: Never    Home Medications Prior to Admission medications   Medication Sig Start Date End Date Taking? Authorizing Provider  aspirin EC 81 MG tablet Take 1 tablet (81 mg total) by mouth daily. 08/18/17   Arty Baumgartner, NP  atorvastatin (LIPITOR) 80 MG tablet TAKE 1 TABLET (80 MG TOTAL) BY MOUTH DAILY AT 6 PM. 10/13/19   Croitoru, Rachelle Hora, MD  cephALEXin (KEFLEX) 500 MG capsule Take 1 capsule (500 mg total) by mouth 3 (three) times daily for 5 days. 11/02/19 11/07/19  Saintclair Schroader C, PA-C  fish oil-omega-3 fatty acids 1000 MG capsule Take 1,400 mg by mouth daily.    [provider]  hydrochlorothiazide (HYDRODIURIL) 12.5 MG tablet Take 1 tablet (12.5 mg total) by mouth daily. 10/10/19 01/08/20  Croitoru, Mihai, MD  metoprolol succinate (TOPROL-XL) 25 MG 24 hr tablet Take 1 tablet (25 mg total) by mouth daily. Take with or immediately following a meal. 09/14/19 12/13/19  Croitoru, Mihai, MD  traMADol (ULTRAM) 50 MG tablet Take 50 mg by mouth every 8 (eight) hours as needed (for pain).    [provider]    Allergies    Erskine Emery  fruit [citrus], Penicillins, and Wine [alcohol]  Review of Systems   Review of Systems  Musculoskeletal: Negative for arthralgias.  Skin: Positive for wound.  Neurological: Negative for weakness and numbness.    Physical Exam Updated Vital Signs BP (!) 171/98   Pulse 66   Temp 98.1 F (36.7 C) (Oral)   Resp 18   SpO2 98%   Physical Exam Vitals and nursing note reviewed.  Constitutional:      General: He is not in acute distress.    Appearance: He is well-developed. He is not diaphoretic.  HENT:     Head: Normocephalic  and atraumatic.  Eyes:     Conjunctiva/sclera: Conjunctivae normal.  Cardiovascular:     Rate and Rhythm: Normal rate and regular rhythm.     Pulses: Normal pulses.          Radial pulses are 2+ on the right side.  Pulmonary:     Effort: Pulmonary effort is normal.  Musculoskeletal:     Cervical back: Neck supple.     Comments: 4.5 cm laceration to the right radial forearm, as shown. Full range of motion in the right wrist without noted difficulty or pain.  Skin:    General: Skin is warm and dry.     Coloration: Skin is not pale.  Neurological:     Mental Status: He is alert.     Comments: Patient endorses some decreased sensation to light touch on the radial aspect of the right wrist, distal to the laceration.  However, this seems to be localized and he has full sensation throughout the right hand. Sensation grossly intact to light touch through each of the nerve distributions of the right upper extremity. Abduction and adduction of the fingers intact against resistance. Grip strength equal bilaterally. Supination and pronation intact against resistance. Strength 5/5 through the cardinal directions of the bilateral wrists. Strength 5/5 with flexion and extension of the bilateral elbows. Patient can touch the thumb to each one of the fingertips without difficulty.  Patient can hold the "OK" sign against resistance.  Psychiatric:        Behavior: Behavior normal.            ED Results / Procedures / Treatments   Labs (all labs ordered are listed, but only abnormal results are displayed) Labs Reviewed - No data to display  EKG None  Radiology DG Forearm Right  Result Date: 11/02/2019 CLINICAL DATA:  Grinder injury, right arm laceration, bleeding controlled in triage, able to move all fingers EXAM: RIGHT FOREARM - 2 VIEW COMPARISON:  None FINDINGS: There is soft tissue laceration along the radial aspect of the distal forearm with punctate radiodensities within the soft  tissues likely reflecting some radiodense debris. No subjacent osseous injury or defect is evident. There is surrounding soft tissue swelling. Radius and ulna are intact. Alignment of the elbow and wrist is grossly preserved on these nondedicated radiographs. IMPRESSION: Soft tissue laceration along the radial aspect of the distal forearm with punctate radiodensities within the soft tissues likely reflecting some radiodense debris. No subjacent osseous injury or defect is evident. Electronically Signed   By: Kreg Shropshire M.D.   On: 11/02/2019 18:45    Procedures .Marland KitchenLaceration Repair  Date/Time: 11/02/2019 7:45 PM Performed by: Anselm Pancoast, PA-C Authorized by: Anselm Pancoast, PA-C   Consent:    Consent obtained:  Verbal   Consent given by:  Patient   Risks discussed:  Infection, pain, poor cosmetic result, poor wound healing,  need for additional repair and retained foreign body Anesthesia (see MAR for exact dosages):    Anesthesia method:  Local infiltration   Local anesthetic:  Lidocaine 2% WITH epi and bupivacaine 0.5% w/o epi Laceration details:    Location:  Shoulder/arm   Shoulder/arm location:  R lower arm   Length (cm):  4.5 Repair type:    Repair type:  Intermediate Pre-procedure details:    Preparation:  Patient was prepped and draped in usual sterile fashion Exploration:    Hemostasis achieved with:  Epinephrine   Wound exploration: wound explored through full range of motion and entire depth of wound probed and visualized     Wound extent: foreign bodies/material     Foreign bodies/material:  Debris Treatment:    Area cleansed with:  Betadine and saline   Amount of cleaning:  Extensive   Irrigation solution:  Sterile saline   Irrigation volume:  1000cc   Irrigation method:  Syringe   Visualized foreign bodies/material removed: yes   Skin repair:    Repair method:  Sutures   Suture size:  3-0   Suture material:  Prolene   Suture technique:  Simple interrupted   Number  of sutures:  6 Approximation:    Approximation:  Loose Post-procedure details:    Dressing:  Non-adherent dressing and sterile dressing   Patient tolerance of procedure:  Tolerated well, no immediate complications   (including critical care time)  Medications Ordered in ED Medications  bupivacaine (MARCAINE) 0.5 % injection 10 mL (10 mLs Infiltration Given 11/02/19 1935)  lidocaine-EPINEPHrine (XYLOCAINE W/EPI) 2 %-1:200000 (PF) injection 10 mL (10 mLs Infiltration Given 11/02/19 1936)  cephALEXin (KEFLEX) capsule 500 mg (500 mg Oral Given 11/02/19 2027)    ED Course  I have reviewed the triage vital signs and the nursing notes.  Pertinent labs & imaging results that were available during my care of the patient were reviewed by me and considered in my medical decision making (see chart for details).  Clinical Course as of Nov 03 1151  Thu Nov 02, 2019  1810 Patient states he has been taking his medications.  He is asymptomatic to this value.  He was advised to have this rechecked and followed up upon by his PCP.  BP(!): 171/98 [SJ]  2966 79 year old male here for laceration of right wrist after it again into a grinder.  Does get a little bit of numbness distally at the thumb.  Normal motor function.  Wound is repaired, follow-up with PCP for suture removal   [MB]    Clinical Course User Index [MB] Hayden Rasmussen, MD [[facility]] Addalee Kavanagh, Helane Gunther, PA-C   MDM Rules/Calculators/A&P                      Patient presents with laceration to the right forearm. He has some small area of decreased sensation distal to the injury, but sensation seems to be fully intact distal to this small area. Small bits of grit were noted in the wound and on x-ray.  These were able to be visualized during wound cleaning and were flushed from the wound. He was advised to follow-up with his PCP within the next couple days for wound check.  He was also given information for hand surgery follow-up should sensation  deficits need to be further assessed. Chart review shows patient has had keflex in the past without difficulty.   The patient was given instructions for home care as well as return precautions. Patient  voices understanding of these instructions, accepts the plan, and is comfortable with discharge.  I reviewed and interpreted the patient's radiological studies.  Findings and plan of care discussed with Erasmo Score, MD. Dr. Charm Barges personally evaluated and examined this patient.  Final Clinical Impression(s) / ED Diagnoses Final diagnoses:  Laceration of right forearm, initial encounter    Rx / DC Orders ED Discharge Orders         Ordered    cephALEXin (KEFLEX) 500 MG capsule  3 times daily     11/02/19 2026           Concepcion Living 11/03/19 1157    Terrilee Files, MD 11/03/19 1545

## 2019-11-02 NOTE — ED Notes (Signed)
Marcaine and xylocaine w/epi at bedside.

## 2019-11-02 NOTE — ED Triage Notes (Signed)
Pt arrives to ED w/ c/o R arm laceration that happened while pt was using a grinder. Pt able to move all fingers. Bleeding controlled in triage. Pt denies pain at this time.

## 2019-11-03 DIAGNOSIS — E7800 Pure hypercholesterolemia, unspecified: Secondary | ICD-10-CM | POA: Diagnosis not present

## 2019-11-03 DIAGNOSIS — I10 Essential (primary) hypertension: Secondary | ICD-10-CM | POA: Diagnosis not present

## 2019-11-03 DIAGNOSIS — I2510 Atherosclerotic heart disease of native coronary artery without angina pectoris: Secondary | ICD-10-CM | POA: Diagnosis not present

## 2019-11-03 DIAGNOSIS — S51811D Laceration without foreign body of right forearm, subsequent encounter: Secondary | ICD-10-CM | POA: Diagnosis not present

## 2019-12-08 ENCOUNTER — Other Ambulatory Visit: Payer: Self-pay | Admitting: Cardiovascular Disease

## 2019-12-11 ENCOUNTER — Other Ambulatory Visit: Payer: Self-pay

## 2020-01-01 ENCOUNTER — Other Ambulatory Visit: Payer: Self-pay | Admitting: Cardiovascular Disease

## 2020-01-04 NOTE — Telephone Encounter (Signed)
Disp Refills Start End   hydrochlorothiazide (HYDRODIURIL) 12.5 MG tablet 90 tablet 1 01/04/2020    Sig: TAKE 1 TABLET BY MOUTH EVERY DAY   Sent to pharmacy as: hydrochlorothiazide (HYDRODIURIL) 12.5 MG tablet   E-Prescribing Status: Sent to pharmacy (01/04/2020 9:56 AM EDT)   Pharmacy  [id number] - [place], [place] - [address]

## 2020-04-01 DIAGNOSIS — I10 Essential (primary) hypertension: Secondary | ICD-10-CM | POA: Diagnosis not present

## 2020-04-01 DIAGNOSIS — Z1389 Encounter for screening for other disorder: Secondary | ICD-10-CM | POA: Diagnosis not present

## 2020-04-01 DIAGNOSIS — Z23 Encounter for immunization: Secondary | ICD-10-CM | POA: Diagnosis not present

## 2020-04-01 DIAGNOSIS — I2510 Atherosclerotic heart disease of native coronary artery without angina pectoris: Secondary | ICD-10-CM | POA: Diagnosis not present

## 2020-04-01 DIAGNOSIS — Z0000 Encounter for general adult medical examination without abnormal findings: Secondary | ICD-10-CM | POA: Diagnosis not present

## 2020-04-01 DIAGNOSIS — I252 Old myocardial infarction: Secondary | ICD-10-CM | POA: Diagnosis not present

## 2020-04-01 DIAGNOSIS — E7800 Pure hypercholesterolemia, unspecified: Secondary | ICD-10-CM | POA: Diagnosis not present

## 2020-04-01 DIAGNOSIS — K219 Gastro-esophageal reflux disease without esophagitis: Secondary | ICD-10-CM | POA: Diagnosis not present

## 2020-04-29 ENCOUNTER — Ambulatory Visit: Payer: Medicare Other | Attending: Internal Medicine

## 2020-04-29 DIAGNOSIS — Z23 Encounter for immunization: Secondary | ICD-10-CM

## 2020-04-29 NOTE — Progress Notes (Signed)
   Covid-19 Vaccination Clinic  Name:  [name]    MRN: [id number] DOB: [birth year]  04/29/2020  [name] was observed post Covid-19 immunization for 15 minutes without incident. He was provided with Vaccine Information Sheet and instruction to access the V-Safe system.   [name] was instructed to call 911 with any severe reactions post vaccine: Marland Kitchen Difficulty breathing  . Swelling of face and throat  . A fast heartbeat  . A bad rash all over body  . Dizziness and weakness

## 2020-06-29 ENCOUNTER — Other Ambulatory Visit: Payer: Self-pay | Admitting: Cardiovascular Disease

## 2020-08-29 ENCOUNTER — Ambulatory Visit (INDEPENDENT_AMBULATORY_CARE_PROVIDER_SITE_OTHER): Payer: Medicare Other | Admitting: Cardiovascular Disease

## 2020-08-29 ENCOUNTER — Encounter: Payer: Self-pay | Admitting: Cardiovascular Disease

## 2020-08-29 ENCOUNTER — Other Ambulatory Visit: Payer: Self-pay

## 2020-08-29 VITALS — BP 182/94 | HR 64 | Ht 69.0 in | Wt 153.4 lb

## 2020-08-29 DIAGNOSIS — I10 Essential (primary) hypertension: Secondary | ICD-10-CM | POA: Diagnosis not present

## 2020-08-29 DIAGNOSIS — I2510 Atherosclerotic heart disease of native coronary artery without angina pectoris: Secondary | ICD-10-CM | POA: Diagnosis not present

## 2020-08-29 DIAGNOSIS — E7800 Pure hypercholesterolemia, unspecified: Secondary | ICD-10-CM

## 2020-08-29 LAB — CBC
Hematocrit: 42.1 % (ref 37.5–51.0)
Hemoglobin: 14.5 g/dL (ref 13.0–17.7)
MCH: 31.4 pg (ref 26.6–33.0)
MCHC: 34.4 g/dL (ref 31.5–35.7)
MCV: 91 fL (ref 79–97)
Platelets: 149 10*3/uL — ABNORMAL LOW (ref 150–450)
RBC: 4.62 x10E6/uL (ref 4.14–5.80)
RDW: 13 % (ref 11.6–15.4)
WBC: 5.4 10*3/uL (ref 3.4–10.8)

## 2020-08-29 LAB — COMPREHENSIVE METABOLIC PANEL
ALT: 33 IU/L (ref 0–44)
AST: 27 IU/L (ref 0–40)
Albumin/Globulin Ratio: 1.5 (ref 1.2–2.2)
Albumin: 4.4 g/dL (ref 3.6–4.6)
Alkaline Phosphatase: 106 IU/L (ref 44–121)
BUN/Creatinine Ratio: 17 (ref 10–24)
BUN: 16 mg/dL (ref 8–27)
Bilirubin Total: 0.9 mg/dL (ref 0.0–1.2)
CO2: 29 mmol/L (ref 20–29)
Calcium: 9.1 mg/dL (ref 8.6–10.2)
Chloride: 94 mmol/L — ABNORMAL LOW (ref 96–106)
Creatinine, Ser: 0.95 mg/dL (ref 0.76–1.27)
GFR calc Af Amer: 86 mL/min/{1.73_m2} (ref >59)
GFR calc non Af Amer: 75 mL/min/{1.73_m2} (ref >59)
Globulin, Total: 2.9 g/dL (ref 1.5–4.5)
Glucose: 93 mg/dL (ref 65–99)
Potassium: 4.9 mmol/L (ref 3.5–5.2)
Sodium: 134 mmol/L (ref 134–144)
Total Protein: 7.3 g/dL (ref 6.0–8.5)

## 2020-08-29 LAB — LIPID PANEL
Chol/HDL Ratio: 2.1 ratio (ref 0.0–5.0)
Cholesterol, Total: 121 mg/dL (ref 100–199)
HDL: 58 mg/dL (ref >39)
LDL Chol Calc (NIH): 51 mg/dL (ref 0–99)
Triglycerides: 51 mg/dL (ref 0–149)
VLDL Cholesterol Cal: 12 mg/dL (ref 5–40)

## 2020-08-29 LAB — TSH: TSH: 3.09 u[IU]/mL (ref 0.450–4.500)

## 2020-08-29 MED ORDER — LOSARTAN POTASSIUM 50 MG PO TABS: 50.0000 mg | ORAL_TABLET | Freq: Every day | ORAL | 3 refills | Status: DC

## 2020-08-29 MED ORDER — METOPROLOL SUCCINATE ER 25 MG PO TB24: 12.5000 mg | ORAL_TABLET | Freq: Every day | ORAL | 3 refills | Status: DC

## 2020-08-29 NOTE — Progress Notes (Signed)
Cardiology Office Note:    Date:  08/29/2020   ID:  [name], DOB Apr 22, 1941, MRN [id number]  PCP:  Tally Joe, MD  Cardiologist:  Thurmon Fair, MD   Referring MD: Tally Joe, MD   Chief Complaint  Patient presents with  . Coronary Artery Disease     History of Present Illness:    [name] is a 80 y.o. male with a hx of coronary artery disease presenting with a very small non-STEMI in January 2019, followed by two-vessel revascularization with placement of drug-eluting stents to the mid LAD artery (overlapping STENT SYNERGY DES 2.25X32 (2.4 mm) & STENT SYNERGY DES 2.75X16. (3.25 mm), with rescue angioplasty of the second diagonal artery) and left circumflex coronary artery (STENT SYNERGY DES 2.5X12.).  Left ventricular systolic function was mildly depressed with an EF of 45% at presentation, but a repeat echocardiogram performed in April 2019 showed complete normalization of wall motion and overall EF.  He also has long-standing, but well treated hypertension.   Shortness of breath reported last year improved with the switch from carvedilol to metoprolol.  He does complain of some fatigue.  He is very physically active, much more so than the average person his age.  He walks a mile a day every day of the week and often has to carry bales of hay or feed.  He keeps a very detailed log of his blood pressure and heart rate.  His heart rate is frequently in the low 50s.  His blood pressure is usually in the 120s-130s/70s, but can be as high as 164/93.  His rhythm today is sinus rhythm with occasional premature atrial complexes.  His previous angina was a heartburn-like sensation that radiated to his right shoulder.  It has not occurred over the last 12 months.  He denies orthopnea, PND, intermittent claudication or lower extremity edema.  He becomes dizzy sometimes when he rolls over in bed, but not when he sits up or when he stands up.  Past Medical History:   Diagnosis Date  . Hyperlipidemia   . Hypertension   . NSTEMI (non-ST elevated myocardial infarction) (HCC) 08/16/2017   s/p PCI/DES to LAD x2, and Lcx x1. Normal EF    Past Surgical History:  Procedure Laterality Date  . CORONARY BALLOON ANGIOPLASTY N/A 08/17/2017   Procedure: CORONARY BALLOON ANGIOPLASTY;  Surgeon: [name], MD;  Location: [facility] INVASIVE CV LAB;  Service: Cardiovascular;  Laterality: N/A;  . CORONARY STENT INTERVENTION N/A 08/17/2017   Procedure: CORONARY STENT INTERVENTION;  Surgeon: [name], MD;  Location: [facility] INVASIVE CV LAB;  Service: Cardiovascular;  Laterality: N/A;  . LEFT HEART CATH AND CORONARY ANGIOGRAPHY N/A 08/17/2017   Procedure: LEFT HEART CATH AND CORONARY ANGIOGRAPHY;  Surgeon: [name], MD;  Location: [facility] INVASIVE CV LAB;  Service: Cardiovascular;  Laterality: N/A;    Current Medications: Current Meds  Medication Sig  . aspirin EC 81 MG tablet Take 1 tablet (81 mg total) by mouth daily.  Marland Kitchen atorvastatin (LIPITOR) 80 MG tablet TAKE 1 TABLET (80 MG TOTAL) BY MOUTH DAILY AT 6 PM.  . fish oil-omega-3 fatty acids 1000 MG capsule Take 1,400 mg by mouth daily.  . hydrochlorothiazide (HYDRODIURIL) 12.5 MG tablet TAKE 1 TABLET BY MOUTH EVERY DAY  . losartan (COZAAR) 50 MG tablet Take 1 tablet (50 mg total) by mouth daily.  . traMADol (ULTRAM) 50 MG tablet Take 50 mg by mouth every 8 (eight) hours as needed (for pain).  Allergies:   Orange fruit [citrus], Penicillins, and Wine [alcohol]   Social History   Socioeconomic History  . Marital status: Married    Spouse name: Not on file  . Number of children: Not on file  . Years of education: Not on file  . Highest education level: Not on file  Occupational History  . Not on file  Tobacco Use  . Smoking status: Never Smoker  . Smokeless tobacco: Never Used  Vaping Use  . Vaping Use: Never used  Substance and Sexual Activity  . Alcohol use: No  . Drug use: Never  . Sexual  activity: Never  Other Topics Concern  . Not on file  Social History Narrative  . Not on file   Social Determinants of Health   Financial Resource Strain: Not on file  Food Insecurity: Not on file  Transportation Needs: Not on file  Physical Activity: Not on file  Stress: Not on file  Social Connections: Not on file     Family History: The patient's family history is significantly negative for early onset coronary/vascular disease. ROS:   Please see the history of present illness.    All other systems are reviewed and are negative.   EKGs/Labs/Other Studies Reviewed:    The following studies were reviewed today:  ECHO 11/12/2017 - Left ventricle: The cavity size was normal. Wall thickness was   normal. Systolic function was normal. The estimated ejection   fraction was in the range of 55% to 60%. Wall motion was normal;   there were no regional wall motion abnormalities. Left   ventricular diastolic function parameters were normal. - Mitral valve: There was mild regurgitation.  Cardiac catheterization August 17 2017  There is mild left ventricular systolic dysfunction. The left ventricular ejection fraction is 45-50% by visual estimate.  LV end diastolic pressure is normal.  -------------------------------------------------  Mid Cx lesion is 99% stenosed.  A drug-eluting stent was successfully placed using a STENT SYNERGY DES 2.5X12.  Post intervention, there is a 0% residual stenosis.  _____________________________________________________________________  Mid LAD-1 lesion is 95% stenosed. Mid LAD-2 lesion is 90% stenosed. Mid LAD to Dist LAD lesion is 80% stenosed.  2 Overlapping drug-eluting stents were successfully placed using a STENT SYNERGY DES 2.25X32 (2.4 mm) & STENT SYNERGY DES 2.75X16. (3.25 mm)  Post intervention, there is a 0% residual stenosis.  _____________________________________________________________________  [name] 2nd Diag lesion is 99%  stenosed. (Bifurcation lesion - planned PTCA only)  Balloon angioplasty was performed using a BALLOON WOLVERINE 2.50X10. Post intervention, there is a 50% residual stenosis.   Successful multisite PCI of the circumflex and LAD with PTCA of the D2. Mildly reduced EF with inferolateral hypokinesis.   ECHO 09/13/2019:   Study Result    ECHOCARDIOGRAM REPORT       Patient Name:  MANNING LUNA [facility] Date of Exam: 09/13/2019  Medical Rec #: [id number]     Height:    69.0 in  Accession #:  [id number]    Weight:    158.0 lb  Date of Birth: 05-Mar-1941     BSA:     1.869 m  Patient Age:  78 years     BP:      130/72 mmHg  Patient Gender: M         HR:      58 bpm.  Exam Location: [address]   Procedure: 2D Echo, Cardiac Doppler and Color Doppler   Indications:  R06.02    History:    Patient  has prior history of Echocardiogram examinations,  most         recent 11/12/2017. Previous Myocardial Infarction and CAD,         Signs/Symptoms:Shortness of Breath; Risk Factors:Former  Smoker,         Hypertension and Dyslipidemia.    Sonographer:  Samule Ohm RDCS  Referring Phys: [phone] Imari Reen   IMPRESSIONS    1. Left ventricular ejection fraction, by estimation, is 55 to 60%. The  left ventricle has normal function. The left ventricle has no regional  wall motion abnormalities. Left ventricular diastolic parameters were  normal.  2. Right ventricular systolic function is normal. The right ventricular  size is normal. Tricuspid regurgitation signal is inadequate for assessing  PA pressure.  3. The mitral valve is normal in structure and function. Trivial mitral  valve regurgitation. No evidence of mitral stenosis.  4. The aortic valve is tricuspid. Aortic valve regurgitation is not  visualized. No aortic stenosis is present.  5. The inferior vena cava is normal in size with  greater than 50%  respiratory variability, suggesting right atrial pressure of 3 mmHg.      EKG:  EKG is ordered today.  It shows sinus rhythm with subtle nonspecific ST changes most prominently seen in V5-V6  Recent Labs: No results found for requested labs within last 8760 hours.  Recent Lipid Panel    Component Value Date/Time   CHOL 117 08/30/2019 1004   TRIG 51 08/30/2019 1004   HDL 56 08/30/2019 1004   CHOLHDL 2.1 08/30/2019 1004   CHOLHDL 3.5 08/17/2017 0407   VLDL 8 08/17/2017 0407   LDLCALC 49 08/30/2019 1004     Physical Exam:    VS:  BP (!) 182/94   Pulse 64   Ht 5\' 9"  (1.753 m)   Wt 153 lb 6.4 oz (69.6 kg)   BMI 22.65 kg/m     Wt Readings from Last 3 Encounters:  08/29/20 153 lb 6.4 oz (69.6 kg)  11/02/19 147 lb (66.7 kg)  08/30/19 158 lb (71.7 kg)     General: Alert, oriented x3, no distress, appears younger than stated age Head: no evidence of trauma, PERRL, EOMI, no exophtalmos or lid lag, no myxedema, no xanthelasma; normal ears, nose and oropharynx Neck: normal jugular venous pulsations and no hepatojugular reflux; brisk carotid pulses without delay and no carotid bruits Chest: clear to auscultation, no signs of consolidation by percussion or palpation, normal fremitus, symmetrical and full respiratory excursions Cardiovascular: normal position and quality of the apical impulse, regular rhythm, normal first and second heart sounds, no murmurs, rubs or gallops Abdomen: no tenderness or distention, no masses by palpation, no abnormal pulsatility or arterial bruits, normal bowel sounds, no hepatosplenomegaly Extremities: no clubbing, cyanosis or edema; 2+ radial, ulnar and brachial pulses bilaterally; 2+ right femoral, posterior tibial and dorsalis pedis pulses; 2+ left femoral, posterior tibial and dorsalis pedis pulses; no subclavian or femoral bruits Neurological: grossly nonfocal Psych: Normal mood and affect  ASSESSMENT:    1. Coronary artery  disease involving native coronary artery of native heart without angina pectoris   2. Hypercholesterolemia   3. Essential hypertension    PLAN:    In order of problems listed above:  1. CAD: Despite a very active lifestyle, he does not have angina.  On aspirin and statin.  Complains of fatigue and is often bradycardic.  We will decrease his beta-blocker to half the current dose.  Also note that he had more problems  with shortness of breath when he was taking carvedilol, suggesting a possible component of reactive airway disease. 2. HLP: Recheck fasting lipid profile today.  Plan to continue his statin.  Target LDL less than 70. 3. HTN: Planning to decrease his beta-blocker dose due to bradycardia and fatigue.  Unusually high blood pressure today, but often high when he checks it at home as well, just not as bad.    Avoid amlodipine due to previous issues with edema.  Had some problems with cough while taking ACE inhibitors, although causality not clear.  We will add losartan 50 mg once daily as we cut back on his metoprolol.  Continue the low-dose of hydrochlorothiazide.  Medication Adjustments/Labs and Tests Ordered: Current medicines are reviewed at length with the patient today.  Concerns regarding medicines are outlined above.  Orders Placed This Encounter  Procedures  . Comprehensive metabolic panel  . CBC  . TSH  . Lipid panel  . EKG 12-Lead   Meds ordered this encounter  Medications  . metoprolol succinate (TOPROL-XL) 25 MG 24 hr tablet    Sig: Take 0.5 tablets (12.5 mg total) by mouth daily. Take with or immediately following a meal.    Dispense:  45 tablet    Refill:  3  . losartan (COZAAR) 50 MG tablet    Sig: Take 1 tablet (50 mg total) by mouth daily.    Dispense:  90 tablet    Refill:  3    Patient Instructions  Medication Instructions:  DECREASE the Metoprolol Succinate to 12.5 mg (half a tablet) once daily START Losartan 50 mg once daily  *If you need a refill on  your cardiac medications before your next appointment, please call your pharmacy*   Lab Work: Your provider would like for you to have the following labs today: CBC, CMET, TSH and Lipid  If you have labs (blood work) drawn today and your tests are completely normal, you will receive your results only by: Marland Kitchen MyChart Message (if you have MyChart) OR . A paper copy in the mail If you have any lab test that is abnormal or we need to change your treatment, we will call you to review the results.   Testing/Procedures: None ordered   Follow-Up: At [facility], you and your health needs are our priority.  As part of our continuing mission to provide you with exceptional heart care, we have created designated Provider Care Teams.  These Care Teams include your primary Cardiologist (physician) and Advanced Practice Providers (APPs -  Physician Assistants and Nurse Practitioners) who all work together to provide you with the care you need, when you need it.  We recommend signing up for the patient portal called "MyChart".  Sign up information is provided on this After Visit Summary.  MyChart is used to connect with patients for Virtual Visits (Telemedicine).  Patients are able to view lab/test results, encounter notes, upcoming appointments, etc.  Non-urgent messages can be sent to your provider as well.   To learn more about what you can do with MyChart, go to [email or web address].    Your next appointment:   12 month(s)  The format for your next appointment:   In Person  Provider:   You may see Thurmon Fair, MD or one of the following Advanced Practice Providers on your designated Care Team:    Azalee Course, PA-C  Micah Flesher, [place] or   Judy Pimple, PA-C      Signed, Thurmon Fair, MD  08/29/2020 1:06  PM    [facility]

## 2020-08-29 NOTE — Patient Instructions (Addendum)
Medication Instructions:  DECREASE the Metoprolol Succinate to 12.5 mg (half a tablet) once daily START Losartan 50 mg once daily  *If you need a refill on your cardiac medications before your next appointment, please call your pharmacy*   Lab Work: Your provider would like for you to have the following labs today: CBC, CMET, TSH and Lipid  If you have labs (blood work) drawn today and your tests are completely normal, you will receive your results only by: Marland Kitchen MyChart Message (if you have MyChart) OR . A paper copy in the mail If you have any lab test that is abnormal or we need to change your treatment, we will call you to review the results.   Testing/Procedures: None ordered   Follow-Up: At [facility], you and your health needs are our priority.  As part of our continuing mission to provide you with exceptional heart care, we have created designated Provider Care Teams.  These Care Teams include your primary Cardiologist (physician) and Advanced Practice Providers (APPs -  Physician Assistants and Nurse Practitioners) who all work together to provide you with the care you need, when you need it.  We recommend signing up for the patient portal called "MyChart".  Sign up information is provided on this After Visit Summary.  MyChart is used to connect with patients for Virtual Visits (Telemedicine).  Patients are able to view lab/test results, encounter notes, upcoming appointments, etc.  Non-urgent messages can be sent to your provider as well.   To learn more about what you can do with MyChart, go to [email or web address].    Your next appointment:   12 month(s)  The format for your next appointment:   In Person  Provider:   You may see Thurmon Fair, MD or one of the following Advanced Practice Providers on your designated Care Team:    Azalee Course, PA-C  Micah Flesher, PA-C or   Judy Pimple, [place]

## 2020-09-02 ENCOUNTER — Encounter: Payer: Self-pay | Admitting: *Deleted

## 2020-10-18 ENCOUNTER — Other Ambulatory Visit: Payer: Self-pay | Admitting: Cardiovascular Disease

## 2020-12-06 ENCOUNTER — Other Ambulatory Visit: Payer: Self-pay

## 2020-12-06 ENCOUNTER — Ambulatory Visit: Payer: Medicare Other | Attending: Internal Medicine

## 2020-12-06 DIAGNOSIS — Z23 Encounter for immunization: Secondary | ICD-10-CM

## 2020-12-06 MED ORDER — PFIZER-BIONT COVID-19 VAC-TRIS 30 MCG/0.3ML IM SUSP
INTRAMUSCULAR | 0 refills | Status: DC
  Filled 2020-12-06: qty 0.3, 1d supply, fill #0

## 2020-12-06 NOTE — Progress Notes (Signed)
   Covid-19 Vaccination Clinic  Name:  [name]    MRN: [id number] DOB: 1941/03/29  12/06/2020  [name] was observed post Covid-19 immunization for 15 minutes without incident. He was provided with Vaccine Information Sheet and instruction to access the V-Safe system.   [name] was instructed to call 911 with any severe reactions post vaccine: Marland Kitchen Difficulty breathing  . Swelling of face and throat  . A fast heartbeat  . A bad rash all over body  . Dizziness and weakness   Immunizations Administered    Name Date Dose VIS Date Route   PFIZER Comrnaty(Gray TOP) Covid-19 Vaccine 12/06/2020  9:43 AM 0.3 mL 06/27/2020 Intramuscular   Manufacturer: [employer], [employer]   Lot: [id number]   NDC: [address]-2     Drusilla Kanner, PharmD, MBA  Clinical Acute Care Pharmacist

## 2020-12-25 ENCOUNTER — Other Ambulatory Visit: Payer: Self-pay | Admitting: Cardiovascular Disease

## 2021-01-29 DIAGNOSIS — H43811 Vitreous degeneration, right eye: Secondary | ICD-10-CM | POA: Diagnosis not present

## 2021-01-29 DIAGNOSIS — H0288B Meibomian gland dysfunction left eye, upper and lower eyelids: Secondary | ICD-10-CM | POA: Diagnosis not present

## 2021-01-29 DIAGNOSIS — H26491 Other secondary cataract, right eye: Secondary | ICD-10-CM | POA: Diagnosis not present

## 2021-01-29 DIAGNOSIS — H524 Presbyopia: Secondary | ICD-10-CM | POA: Diagnosis not present

## 2021-01-29 DIAGNOSIS — H0288A Meibomian gland dysfunction right eye, upper and lower eyelids: Secondary | ICD-10-CM | POA: Diagnosis not present

## 2021-01-29 DIAGNOSIS — H2512 Age-related nuclear cataract, left eye: Secondary | ICD-10-CM | POA: Diagnosis not present

## 2021-04-04 DIAGNOSIS — I10 Essential (primary) hypertension: Secondary | ICD-10-CM | POA: Diagnosis not present

## 2021-04-04 DIAGNOSIS — D696 Thrombocytopenia, unspecified: Secondary | ICD-10-CM | POA: Diagnosis not present

## 2021-04-04 DIAGNOSIS — I25111 Atherosclerotic heart disease of native coronary artery with angina pectoris with documented spasm: Secondary | ICD-10-CM | POA: Diagnosis not present

## 2021-04-04 DIAGNOSIS — Z1389 Encounter for screening for other disorder: Secondary | ICD-10-CM | POA: Diagnosis not present

## 2021-04-04 DIAGNOSIS — E7800 Pure hypercholesterolemia, unspecified: Secondary | ICD-10-CM | POA: Diagnosis not present

## 2021-04-04 DIAGNOSIS — Z0000 Encounter for general adult medical examination without abnormal findings: Secondary | ICD-10-CM | POA: Diagnosis not present

## 2021-04-04 DIAGNOSIS — Z23 Encounter for immunization: Secondary | ICD-10-CM | POA: Diagnosis not present

## 2021-04-04 DIAGNOSIS — I252 Old myocardial infarction: Secondary | ICD-10-CM | POA: Diagnosis not present

## 2021-04-04 DIAGNOSIS — K219 Gastro-esophageal reflux disease without esophagitis: Secondary | ICD-10-CM | POA: Diagnosis not present

## 2021-04-08 ENCOUNTER — Ambulatory Visit: Payer: Medicare Other | Attending: Internal Medicine

## 2021-04-08 ENCOUNTER — Other Ambulatory Visit: Payer: Self-pay

## 2021-04-08 DIAGNOSIS — Z23 Encounter for immunization: Secondary | ICD-10-CM

## 2021-04-08 MED ORDER — PFIZER COVID-19 VAC BIVALENT 30 MCG/0.3ML IM SUSP
INTRAMUSCULAR | 0 refills | Status: DC
  Filled 2021-04-08: qty 0.3, 1d supply, fill #0

## 2021-04-08 NOTE — Progress Notes (Signed)
   Covid-19 Vaccination Clinic  Name:  [name]    MRN: [id number] DOB: 22-Aug-1940  04/08/2021  [name] was observed post Covid-19 immunization for 15 minutes without incident. He was provided with Vaccine Information Sheet and instruction to access the V-Safe system.   [name] was instructed to call 911 with any severe reactions post vaccine: Difficulty breathing  Swelling of face and throat  A fast heartbeat  A bad rash all over body  Dizziness and weakness   Drusilla Kanner, PharmD, MBA Clinical Acute Care Pharmacist

## 2021-08-16 ENCOUNTER — Other Ambulatory Visit: Payer: Self-pay | Admitting: Cardiovascular Disease

## 2021-09-18 ENCOUNTER — Other Ambulatory Visit: Payer: Self-pay | Admitting: Cardiovascular Disease

## 2021-09-22 ENCOUNTER — Other Ambulatory Visit: Payer: Self-pay

## 2021-09-22 ENCOUNTER — Ambulatory Visit (INDEPENDENT_AMBULATORY_CARE_PROVIDER_SITE_OTHER): Payer: Medicare Other | Admitting: Cardiovascular Disease

## 2021-09-22 ENCOUNTER — Encounter: Payer: Self-pay | Admitting: Cardiovascular Disease

## 2021-09-22 VITALS — BP 164/82 | HR 62 | Ht 69.0 in | Wt 151.8 lb

## 2021-09-22 DIAGNOSIS — I2510 Atherosclerotic heart disease of native coronary artery without angina pectoris: Secondary | ICD-10-CM

## 2021-09-22 DIAGNOSIS — I10 Essential (primary) hypertension: Secondary | ICD-10-CM | POA: Diagnosis not present

## 2021-09-22 DIAGNOSIS — E7800 Pure hypercholesterolemia, unspecified: Secondary | ICD-10-CM

## 2021-09-22 LAB — COMPREHENSIVE METABOLIC PANEL
ALT: 28 IU/L (ref 0–44)
AST: 27 IU/L (ref 0–40)
Albumin/Globulin Ratio: 1.8 (ref 1.2–2.2)
Albumin: 4.6 g/dL (ref 3.6–4.6)
Alkaline Phosphatase: 97 IU/L (ref 44–121)
BUN/Creatinine Ratio: 15 (ref 10–24)
BUN: 14 mg/dL (ref 8–27)
Bilirubin Total: 1.1 mg/dL (ref 0.0–1.2)
CO2: 30 mmol/L — ABNORMAL HIGH (ref 20–29)
Calcium: 9.1 mg/dL (ref 8.6–10.2)
Chloride: 96 mmol/L (ref 96–106)
Creatinine, Ser: 0.93 mg/dL (ref 0.76–1.27)
Globulin, Total: 2.5 g/dL (ref 1.5–4.5)
Glucose: 86 mg/dL (ref 70–99)
Potassium: 5.1 mmol/L (ref 3.5–5.2)
Sodium: 137 mmol/L (ref 134–144)
Total Protein: 7.1 g/dL (ref 6.0–8.5)
eGFR: 82 mL/min/{1.73_m2} (ref >59)

## 2021-09-22 LAB — LIPID PANEL
Chol/HDL Ratio: 2.1 ratio (ref 0.0–5.0)
Cholesterol, Total: 121 mg/dL (ref 100–199)
HDL: 59 mg/dL (ref >39)
LDL Chol Calc (NIH): 51 mg/dL (ref 0–99)
Triglycerides: 47 mg/dL (ref 0–149)
VLDL Cholesterol Cal: 11 mg/dL (ref 5–40)

## 2021-09-22 NOTE — Progress Notes (Signed)
Cardiology Office Note:    Date:  09/28/2021   ID:  Bobby PlanMichael J Goodall, DOB 1940/11/22, MRN [id number]  PCP:  Tally JoeSwayne, David, MD  Cardiologist:  Thurmon FairMihai Lanson Randle, MD   Referring MD: Tally JoeSwayne, David, MD   Chief Complaint  Patient presents with   Coronary Artery Disease     History of Present Illness:    [name] is a 81 y.o. male with a hx of coronary artery disease presenting with a very small non-STEMI in January 2019, followed by two-vessel revascularization with placement of drug-eluting stents to the mid LAD artery (overlapping STENT SYNERGY DES 2.25X32 (2.4 mm) & STENT SYNERGY DES 2.75X16. (3.25 mm), with rescue angioplasty of the second diagonal artery) and left circumflex coronary artery (STENT SYNERGY DES 2.5X12.).  Left ventricular systolic function was mildly depressed with an EF of 45% at presentation, but a repeat echocardiogram performed in April 2019 showed complete normalization of wall motion and overall EF.  He also has long-standing, but well treated hypertension.   He is doing very well.  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  He denies problems with angina or dyspnea with activity.  He does not have orthopnea, PND, edema, claudication, focal neurological events, palpitations, syncope or other cardiovascular complaints.  He keeps a detailed log of his vital signs and at home his blood pressure is consistently 110-130s/60s, always higher when he is seen in the doctor's office.  Heart rate is consistently in the 50s.  His previous angina was a heartburn-like sensation that radiated to his right shoulder.    Past Medical History:  Diagnosis Date   Hyperlipidemia    Hypertension    NSTEMI (non-ST elevated myocardial infarction) (HCC) 08/16/2017   s/p PCI/DES to LAD x2, and Lcx x1. Normal EF    Past Surgical History:  Procedure Laterality Date   CORONARY BALLOON ANGIOPLASTY N/A 08/17/2017   Procedure: CORONARY BALLOON  ANGIOPLASTY;  Surgeon: Marykay LexHarding, David W, MD;  [facility];  Service: Cardiovascular;  Laterality: N/A;   CORONARY STENT INTERVENTION N/A 08/17/2017   Procedure: CORONARY STENT INTERVENTION;  Surgeon: Marykay LexHarding, David W, MD;  [facility];  Service: Cardiovascular;  Laterality: N/A;   LEFT HEART CATH AND CORONARY ANGIOGRAPHY N/A 08/17/2017   Procedure: LEFT HEART CATH AND CORONARY ANGIOGRAPHY;  Surgeon: Marykay LexHarding, David W, MD;  [facility];  Service: Cardiovascular;  Laterality: N/A;    Current Medications: Current Meds  Medication Sig   aspirin EC 81 MG tablet Take 1 tablet (81 mg total) by mouth daily.   atorvastatin (LIPITOR) 80 MG tablet TAKE 1 TABLET (80 MG TOTAL) BY MOUTH DAILY AT 6 PM.   fish oil-omega-3 fatty acids 1000 MG capsule Take 1,400 mg by mouth daily.   hydrochlorothiazide (HYDRODIURIL) 12.5 MG tablet Take 1 tablet (12.5 mg total) by mouth daily.   losartan (COZAAR) 50 MG tablet TAKE 1 TABLET BY MOUTH EVERY DAY   metoprolol succinate (TOPROL-XL) 25 MG 24 hr tablet TAKE 0.5 TABLETS BY MOUTH DAILY. TAKE WITH OR IMMEDIATELY FOLLOWING A MEAL.     Allergies:   Orange fruit [citrus], Penicillins, and Wine [alcohol]   Social History   Socioeconomic History   Marital status: Married    Spouse name: Not on file   Number of children: Not on file   Years of education: Not on file   Highest education level: Not on file  Occupational History   Not  on file  Tobacco Use   Smoking status: Never   Smokeless tobacco: Never  Vaping Use   Vaping Use: Never used  Substance and Sexual Activity   Alcohol use: No   Drug use: Never   Sexual activity: Never  Other Topics Concern   Not on file  Social History Narrative   Not on file   Social Determinants of Health   Financial Resource Strain: Not on file  Food Insecurity: Not on file  Transportation Needs: Not on file  Physical Activity: Not on file  Stress: Not on file  Social  Connections: Not on file     Family History: The patient's family history is significantly negative for early onset coronary/vascular disease. ROS:   Please see the history of present illness.    All other systems are reviewed and are negative.   EKGs/Labs/Other Studies Reviewed:    The following studies were reviewed today:  ECHO 11/12/2017 - Left ventricle: The cavity size was normal. Wall thickness was   normal. Systolic function was normal. The estimated ejection   fraction was in the range of 55% to 60%. Wall motion was normal;   there were no regional wall motion abnormalities. Left   ventricular diastolic function parameters were normal. - Mitral valve: There was mild regurgitation.  Cardiac catheterization August 17 2017 There is mild left ventricular systolic dysfunction. The left ventricular ejection fraction is 45-50% by visual estimate. LV end diastolic pressure is normal. ------------------------------------------------- Mid Cx lesion is 99% stenosed. A drug-eluting stent was successfully placed using a STENT SYNERGY DES 2.5X12. Post intervention, there is a 0% residual stenosis. _____________________________________________________________________ Mid LAD-1 lesion is 95% stenosed. Mid LAD-2 lesion is 90% stenosed. Mid LAD to Dist LAD lesion is 80% stenosed. 2 Overlapping drug-eluting stents were successfully placed using a STENT SYNERGY DES 2.25X32 (2.4 mm) & STENT SYNERGY DES 2.75X16. (3.25 mm) Post intervention, there is a 0% residual stenosis. _____________________________________________________________________ Suezanne Jacquet 2nd Diag lesion is 99% stenosed. (Bifurcation lesion - planned PTCA only) Balloon angioplasty was performed using a BALLOON WOLVERINE 2.50X10. Post intervention, there is a 50% residual stenosis.   Successful multisite PCI of the circumflex and LAD with PTCA of the D2. Mildly reduced EF with inferolateral hypokinesis.    ECHO 09/13/2019:      1. Left ventricular ejection fraction, by estimation, is 55 to 60%. The  left ventricle has normal function. The left ventricle has no regional  wall motion abnormalities. Left ventricular diastolic parameters were  normal.   2. Right ventricular systolic function is normal. The right ventricular  size is normal. Tricuspid regurgitation signal is inadequate for assessing  PA pressure.   3. The mitral valve is normal in structure and function. Trivial mitral  valve regurgitation. No evidence of mitral stenosis.   4. The aortic valve is tricuspid. Aortic valve regurgitation is not  visualized. No aortic stenosis is present.   5. The inferior vena cava is normal in size with greater than 50%  respiratory variability, suggesting right atrial pressure of 3 mmHg.      EKG:  EKG is ordered today.  It shows sinus rhythm with a single premature atrial contraction and is otherwise normal.  QTc 426 ms.  Recent Labs: 09/22/2021: ALT 28; BUN 14; Creatinine, Ser 0.93; Potassium 5.1; Sodium 137  Recent Lipid Panel    Component Value Date/Time   CHOL 121 09/22/2021 0915   TRIG 47 09/22/2021 0915   HDL 59 09/22/2021 0915   CHOLHDL 2.1 09/22/2021 0915  CHOLHDL 3.5 08/17/2017 0407   VLDL 8 08/17/2017 0407   LDLCALC 51 09/22/2021 0915     Physical Exam:    VS:  BP (!) 164/82    Pulse 62    Ht 5\' 9"  (1.753 m)    Wt 151 lb 12.8 oz (68.9 kg)    SpO2 96%    BMI 22.42 kg/m     Wt Readings from Last 3 Encounters:  09/22/21 151 lb 12.8 oz (68.9 kg)  08/29/20 153 lb 6.4 oz (69.6 kg)  11/02/19 147 lb (66.7 kg)     General: Alert, oriented x3, no distress, appears lean and fit and younger than stated age Head: no evidence of trauma, PERRL, EOMI, no exophtalmos or lid lag, no myxedema, no xanthelasma; normal ears, nose and oropharynx Neck: normal jugular venous pulsations and no hepatojugular reflux; brisk carotid pulses without delay and no carotid bruits Chest: clear to auscultation, no signs of  consolidation by percussion or palpation, normal fremitus, symmetrical and full respiratory excursions Cardiovascular: normal position and quality of the apical impulse, regular rhythm, normal first and second heart sounds, no murmurs, rubs or gallops Abdomen: no tenderness or distention, no masses by palpation, no abnormal pulsatility or arterial bruits, normal bowel sounds, no hepatosplenomegaly Extremities: no clubbing, cyanosis or edema; 2+ radial, ulnar and brachial pulses bilaterally; 2+ right femoral, posterior tibial and dorsalis pedis pulses; 2+ left femoral, posterior tibial and dorsalis pedis pulses; no subclavian or femoral bruits Neurological: grossly nonfocal Psych: Normal mood and affect   ASSESSMENT:    1. Coronary artery disease involving native coronary artery of native heart without angina pectoris   2. Hypercholesterolemia   3. Essential hypertension     PLAN:    In order of problems listed above:  CAD: Denies angina.  Asymptomatic.  On aspirin and statin and very low-dose beta-blocker due to bradycardia.  Complains of fatigue and is often bradycardic.  Also note that he had more problems with shortness of breath when he was taking carvedilol, suggesting a possible component of reactive airway disease. HLP: Lipid profile was rechecked today and shows an excellent LDL of 51 and also excellent HDL of 59. HTN: Has longstanding issues of situational hypertension especially in the doctor's office.  At home his blood pressure is well controlled.    Avoid amlodipine due to previous issues with edema.  Had some problems with cough while taking ACE inhibitors, although causality not clear.  Have had to keep him on low-dose beta-blocker due to bradycardia.  Medication Adjustments/Labs and Tests Ordered: Current medicines are reviewed at length with the patient today.  Concerns regarding medicines are outlined above.  Orders Placed This Encounter  Procedures   Comprehensive  metabolic panel   Lipid panel   EKG 12-Lead   No orders of the defined types were placed in this encounter.   Patient Instructions  Medication Instructions:  No changes *If you need a refill on your cardiac medications before your next appointment, please call your pharmacy*   Lab Work: Your provider would like for you to have the following labs today: Lipid and CMET  If you have labs (blood work) drawn today and your tests are completely normal, you will receive your results only by: MyChart Message (if you have MyChart) OR A paper copy in the mail If you have any lab test that is abnormal or we need to change your treatment, we will call you to review the results.   Testing/Procedures: None ordered  Follow-Up: At [facility], you and your health needs are our priority.  As part of our continuing mission to provide you with exceptional heart care, we have created designated Provider Care Teams.  These Care Teams include your primary Cardiologist (physician) and Advanced Practice Providers (APPs -  Physician Assistants and Nurse Practitioners) who all work together to provide you with the care you need, when you need it.  We recommend signing up for the patient portal called "MyChart".  Sign up information is provided on this After Visit Summary.  MyChart is used to connect with patients for Virtual Visits (Telemedicine).  Patients are able to view lab/test results, encounter notes, upcoming appointments, etc.  Non-urgent messages can be sent to your provider as well.   To learn more about what you can do with MyChart, go to [email or web address].    Your next appointment:   12 month(s)  The format for your next appointment:   In Person  Provider:   Thurmon Fair, MD {      Signed, Thurmon Fair, MD  09/28/2021 2:23 PM    [facility]

## 2021-09-22 NOTE — Patient Instructions (Signed)
Medication Instructions:  °No changes °*If you need a refill on your cardiac medications before your next appointment, please call your pharmacy* ° ° °Lab Work: °Your provider would like for you to have the following labs today: Lipid and CMET ° °If you have labs (blood work) drawn today and your tests are completely normal, you will receive your results only by: °MyChart Message (if you have MyChart) OR °A paper copy in the mail °If you have any lab test that is abnormal or we need to change your treatment, we will call you to review the results. ° ° °Testing/Procedures: °None ordered ° ° °Follow-Up: °At [facility], you and your health needs are our priority.  As part of our continuing mission to provide you with exceptional heart care, we have created designated Provider Care Teams.  These Care Teams include your primary Cardiologist (physician) and Advanced Practice Providers (APPs -  Physician Assistants and Nurse Practitioners) who all work together to provide you with the care you need, when you need it. ° °We recommend signing up for the patient portal called "MyChart".  Sign up information is provided on this After Visit Summary.  MyChart is used to connect with patients for Virtual Visits (Telemedicine).  Patients are able to view lab/test results, encounter notes, upcoming appointments, etc.  Non-urgent messages can be sent to your provider as well.   °To learn more about what you can do with MyChart, go to [email or web address].   ° °Your next appointment:   °12 month(s) ° °The format for your next appointment:   °In Person ° °Provider:   °Mihai Croitoru, MD   ° ° °

## 2021-09-28 ENCOUNTER — Encounter: Payer: Self-pay | Admitting: Cardiovascular Disease

## 2021-10-20 ENCOUNTER — Other Ambulatory Visit: Payer: Self-pay | Admitting: Cardiovascular Disease

## 2021-11-20 ENCOUNTER — Ambulatory Visit: Payer: Medicare Other | Admitting: Cardiovascular Disease

## 2022-02-05 DIAGNOSIS — H43811 Vitreous degeneration, right eye: Secondary | ICD-10-CM | POA: Diagnosis not present

## 2022-02-05 DIAGNOSIS — H524 Presbyopia: Secondary | ICD-10-CM | POA: Diagnosis not present

## 2022-02-05 DIAGNOSIS — H2512 Age-related nuclear cataract, left eye: Secondary | ICD-10-CM | POA: Diagnosis not present

## 2022-02-05 DIAGNOSIS — H26491 Other secondary cataract, right eye: Secondary | ICD-10-CM | POA: Diagnosis not present

## 2022-03-19 ENCOUNTER — Other Ambulatory Visit: Payer: Self-pay | Admitting: Cardiovascular Disease

## 2022-04-06 DIAGNOSIS — Z1331 Encounter for screening for depression: Secondary | ICD-10-CM | POA: Diagnosis not present

## 2022-04-06 DIAGNOSIS — K219 Gastro-esophageal reflux disease without esophagitis: Secondary | ICD-10-CM | POA: Diagnosis not present

## 2022-04-06 DIAGNOSIS — I25111 Atherosclerotic heart disease of native coronary artery with angina pectoris with documented spasm: Secondary | ICD-10-CM | POA: Diagnosis not present

## 2022-04-06 DIAGNOSIS — I252 Old myocardial infarction: Secondary | ICD-10-CM | POA: Diagnosis not present

## 2022-04-06 DIAGNOSIS — Z0000 Encounter for general adult medical examination without abnormal findings: Secondary | ICD-10-CM | POA: Diagnosis not present

## 2022-04-06 DIAGNOSIS — I10 Essential (primary) hypertension: Secondary | ICD-10-CM | POA: Diagnosis not present

## 2022-04-06 DIAGNOSIS — D696 Thrombocytopenia, unspecified: Secondary | ICD-10-CM | POA: Diagnosis not present

## 2022-04-06 DIAGNOSIS — Z23 Encounter for immunization: Secondary | ICD-10-CM | POA: Diagnosis not present

## 2022-04-06 DIAGNOSIS — E7800 Pure hypercholesterolemia, unspecified: Secondary | ICD-10-CM | POA: Diagnosis not present

## 2022-05-08 ENCOUNTER — Other Ambulatory Visit: Payer: Self-pay

## 2022-05-11 ENCOUNTER — Other Ambulatory Visit: Payer: Self-pay

## 2022-05-11 MED ORDER — COVID-19 MRNA 2023-2024 VACCINE (COMIRNATY) 0.3 ML INJECTION
0.3000 mL | Freq: Once | INTRAMUSCULAR | 0 refills | Status: AC
  Filled 2022-05-12: qty 0.3, 1d supply, fill #0

## 2022-05-12 ENCOUNTER — Encounter: Payer: Self-pay | Admitting: Cardiovascular Disease

## 2022-05-12 ENCOUNTER — Other Ambulatory Visit: Payer: Self-pay

## 2022-05-12 DIAGNOSIS — E7800 Pure hypercholesterolemia, unspecified: Secondary | ICD-10-CM

## 2022-05-14 NOTE — Telephone Encounter (Signed)
Lipid and CMET please

## 2022-05-16 ENCOUNTER — Other Ambulatory Visit: Payer: Self-pay | Admitting: Cardiovascular Disease

## 2022-05-19 DIAGNOSIS — Z23 Encounter for immunization: Secondary | ICD-10-CM | POA: Diagnosis not present

## 2022-09-24 ENCOUNTER — Ambulatory Visit: Payer: Medicare Other | Attending: Cardiovascular Disease | Admitting: Cardiovascular Disease

## 2022-09-24 ENCOUNTER — Encounter: Payer: Self-pay | Admitting: Cardiovascular Disease

## 2022-09-24 VITALS — BP 118/68 | HR 64 | Ht 69.0 in | Wt 147.6 lb

## 2022-09-24 DIAGNOSIS — I10 Essential (primary) hypertension: Secondary | ICD-10-CM | POA: Diagnosis not present

## 2022-09-24 DIAGNOSIS — E7800 Pure hypercholesterolemia, unspecified: Secondary | ICD-10-CM | POA: Diagnosis not present

## 2022-09-24 DIAGNOSIS — I2510 Atherosclerotic heart disease of native coronary artery without angina pectoris: Secondary | ICD-10-CM | POA: Insufficient documentation

## 2022-09-24 NOTE — Patient Instructions (Signed)
Medication Instructions:  No changes *If you need a refill on your cardiac medications before your next appointment, please call your pharmacy*  Lab Work: Lipid Panel, CMET- Today If you have labs (blood work) drawn today and your tests are completely normal, you will receive your results only by: [place] (if you have MyChart) OR A paper copy in the mail If you have any lab test that is abnormal or we need to change your treatment, we will call you to review the results.  Follow-Up: At [facility], you and your health needs are our priority.  As part of our continuing mission to provide you with exceptional heart care, we have created designated Provider Care Teams.  These Care Teams include your primary Cardiologist (physician) and Advanced Practice Providers (APPs -  Physician Assistants and Nurse Practitioners) who all work together to provide you with the care you need, when you need it.  We recommend signing up for the patient portal called "MyChart".  Sign up information is provided on this After Visit Summary.  MyChart is used to connect with patients for Virtual Visits (Telemedicine).  Patients are able to view lab/test results, encounter notes, upcoming appointments, etc.  Non-urgent messages can be sent to your provider as well.   To learn more about what you can do with MyChart, go to [email or web address].    Your next appointment:   1 year(s)  Provider:   Sanda Klein, MD

## 2022-09-24 NOTE — Progress Notes (Signed)
Cardiology Office Note:    Date:  09/24/2022   ID:  [name], DOB July 29, 1940, MRN [id number]  PCP:  Antony Contras, MD  Cardiologist:  Sanda Klein, MD   Referring MD: Antony Contras, MD   Chief Complaint  Patient presents with   Coronary Artery Disease     History of Present Illness:    [name] is a 82 y.o. male with a hx of coronary artery disease presenting with a very small non-STEMI in January 2019, followed by two-vessel revascularization with placement of drug-eluting stents to the mid LAD artery (overlapping STENT SYNERGY DES 2.25X32 (2.4 mm) & STENT SYNERGY DES 2.75X16. (3.25 mm), with rescue angioplasty of the second diagonal artery) and left circumflex coronary artery (STENT SYNERGY DES 2.5X12.).  Left ventricular systolic function was mildly depressed with an EF of 45% at presentation, but a repeat echocardiogram performed in April 2019 showed complete normalization of wall motion and overall EF.  He also has long-standing, but well treated hypertension.   He continues to do quite well from a cardiovascular point of view.  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  He does not have orthopnea, PND, edema, palpitations, dizziness, syncope, claudication or any focal neurological events.  As always he brought me a very detailed log of his vital signs.  His heart rate is consistently in the mid 50s to 60s and his blood pressure is usually in the low 100s-110s/60s-70s.  He frequently has situational hypertension in the doctor's office, which is the case today as well.  His previous angina was a heartburn-like sensation that radiated to his right shoulder.    Past Medical History:  Diagnosis Date   Hyperlipidemia     Hypertension    NSTEMI (non-ST elevated myocardial infarction) ([place]) 08/16/2017   s/p PCI/DES to LAD x2, and Lcx x1. Normal EF    Past Surgical History:  Procedure Laterality Date   CORONARY BALLOON ANGIOPLASTY N/A 08/17/2017   Procedure: CORONARY BALLOON ANGIOPLASTY;  Surgeon: Leonie Man, MD;  Location: [place] [facility];  Service: Cardiovascular;  Laterality: N/A;   CORONARY STENT INTERVENTION N/A 08/17/2017   Procedure: CORONARY STENT INTERVENTION;  Surgeon: Leonie Man, MD;  Location: [place] [facility];  Service: Cardiovascular;  Laterality: N/A;   LEFT HEART CATH AND CORONARY ANGIOGRAPHY N/A 08/17/2017   Procedure: LEFT HEART CATH AND CORONARY ANGIOGRAPHY;  Surgeon: Leonie Man, MD;  Location: [facility] [facility];  Service: Cardiovascular;  Laterality: N/A;    Current Medications: Current Meds  Medication Sig   aspirin EC 81 MG tablet Take 1 tablet (81 mg total) by mouth daily.   atorvastatin (LIPITOR) 80 MG tablet TAKE 1 TABLET BY MOUTH DAILY AT 6 PM.   hydrochlorothiazide (HYDRODIURIL) 12.5 MG tablet TAKE 1 TABLET BY MOUTH EVERY DAY   losartan (COZAAR) 50 MG tablet TAKE 1 TABLET BY MOUTH EVERY DAY   metoprolol succinate (TOPROL-XL) 25 MG 24 hr tablet TAKE 1/2 TABLET BY MOUTH DAILY TAKE WITH OR IMMEDIATELY FOLLOWING A MEAL     Allergies:   Orange fruit [citrus], Penicillins, and Wine [alcohol]   Social History   Socioeconomic History   Marital status: Married    Spouse name: Not on file  Number of children: Not on file   Years of education: Not on file   Highest education level: Not on file  Occupational History   Not on file  Tobacco Use   Smoking status: Never   Smokeless tobacco: Never  Vaping Use   Vaping Use: Never used  Substance and Sexual Activity   Alcohol use: No   Drug use: Never   Sexual activity: Never  Other Topics Concern   Not on file  Social History Narrative   Not on file   Social Determinants of Health   Financial Resource  Strain: Not on file  Food Insecurity: Not on file  Transportation Needs: Not on file  Physical Activity: Not on file  Stress: Not on file  Social Connections: Not on file     Family History: The patient's family history is significantly negative for early onset coronary/vascular disease. ROS:   Please see the history of present illness.    All other systems are reviewed and are negative.   EKGs/Labs/Other Studies Reviewed:    The following studies were reviewed today:  ECHO 11/12/2017 - Left ventricle: The cavity size was normal. Wall thickness was   normal. Systolic function was normal. The estimated ejection   fraction was in the range of 55% to 60%. Wall motion was normal;   there were no regional wall motion abnormalities. Left   ventricular diastolic function parameters were normal. - Mitral valve: There was mild regurgitation.  Cardiac catheterization August 17 2017 There is mild left ventricular systolic dysfunction. The left ventricular ejection fraction is 45-50% by visual estimate. LV end diastolic pressure is normal. ------------------------------------------------- Mid Cx lesion is 99% stenosed. A drug-eluting stent was successfully placed using a STENT SYNERGY DES 2.5X12. Post intervention, there is a 0% residual stenosis. _____________________________________________________________________ Mid LAD-1 lesion is 95% stenosed. Mid LAD-2 lesion is 90% stenosed. Mid LAD to Dist LAD lesion is 80% stenosed. 2 Overlapping drug-eluting stents were successfully placed using a STENT SYNERGY DES 2.25X32 (2.4 mm) & STENT SYNERGY DES 2.75X16. (3.25 mm) Post intervention, there is a 0% residual stenosis. _____________________________________________________________________ Colon Flattery 2nd Diag lesion is 99% stenosed. (Bifurcation lesion - planned PTCA only) Balloon angioplasty was performed using a BALLOON WOLVERINE 2.50X10. Post intervention, there is a 50% residual stenosis.    Successful multisite PCI of the circumflex and LAD with PTCA of the D2. Mildly reduced EF with inferolateral hypokinesis.    ECHO 09/13/2019:     1. Left ventricular ejection fraction, by estimation, is 55 to 60%. The  left ventricle has normal function. The left ventricle has no regional  wall motion abnormalities. Left ventricular diastolic parameters were  normal.   2. Right ventricular systolic function is normal. The right ventricular  size is normal. Tricuspid regurgitation signal is inadequate for assessing  PA pressure.   3. The mitral valve is normal in structure and function. Trivial mitral  valve regurgitation. No evidence of mitral stenosis.   4. The aortic valve is tricuspid. Aortic valve regurgitation is not  visualized. No aortic stenosis is present.   5. The inferior vena cava is normal in size with greater than 50%  respiratory variability, suggesting right atrial pressure of 3 mmHg.      EKG:  EKG is ordered today.  It shows sinus rhythm with a single premature atrial contraction and is otherwise normal.  QTc 426 ms.  Recent Labs: No results found for requested labs within last 365 days.  Recent Lipid Panel    Component  Value Date/Time   CHOL 121 09/22/2021 0915   TRIG 47 09/22/2021 0915   HDL 59 09/22/2021 0915   CHOLHDL 2.1 09/22/2021 0915   CHOLHDL 3.5 08/17/2017 0407   VLDL 8 08/17/2017 0407   LDLCALC 51 09/22/2021 0915     Physical Exam:    VS:  BP 118/68   Pulse 64   Ht '5\' 9"'$  (1.753 m)   Wt 147 lb 9.6 oz (67 kg)   SpO2 93%   BMI 21.80 kg/m     Wt Readings from Last 3 Encounters:  09/24/22 147 lb 9.6 oz (67 kg)  09/22/21 151 lb 12.8 oz (68.9 kg)  08/29/20 153 lb 6.4 oz (69.6 kg)      General: Alert, oriented x3, no distress, appears lean and fit Head: no evidence of trauma, PERRL, EOMI, no exophtalmos or lid lag, no myxedema, no xanthelasma; normal ears, nose and oropharynx Neck: normal jugular venous pulsations and no hepatojugular  reflux; brisk carotid pulses without delay and no carotid bruits Chest: clear to auscultation, no signs of consolidation by percussion or palpation, normal fremitus, symmetrical and full respiratory excursions Cardiovascular: normal position and quality of the apical impulse, regular rhythm, normal first and second heart sounds, no murmurs, rubs or gallops Abdomen: no tenderness or distention, no masses by palpation, no abnormal pulsatility or arterial bruits, normal bowel sounds, no hepatosplenomegaly Extremities: no clubbing, cyanosis or edema; 2+ radial, ulnar and brachial pulses bilaterally; 2+ right femoral, posterior tibial and dorsalis pedis pulses; 2+ left femoral, posterior tibial and dorsalis pedis pulses; no subclavian or femoral bruits Neurological: grossly nonfocal Psych: Normal mood and affect    ASSESSMENT:    1. Hypercholesterolemia   2. Coronary artery disease involving native coronary artery of native heart without angina pectoris   3. Essential hypertension      PLAN:    In order of problems listed above:  CAD: Asymptomatic.  On a very low-dose of beta-blocker due to bradycardia.  On aspirin and lipid-lowering therapy.  Also note that he had more problems with shortness of breath when he was taking carvedilol, suggesting a possible component of reactive airway disease. HLP: A year ago his lipid profile was excellent on the current medications.  Time to recheck today. HTN:  has well-documented situational hypertension/whitecoat syndrome at home his blood pressure is well controlled.    Avoid amlodipine due to previous issues with edema.  Had some problems with cough while taking ACE inhibitors, although causality not clear.  Have had to keep him on low-dose beta-blocker due to bradycardia.  Medication Adjustments/Labs and Tests Ordered: Current medicines are reviewed at length with the patient today.  Concerns regarding medicines are outlined above.  Orders Placed This  Encounter  Procedures   Lipid panel   Comprehensive metabolic panel   EKG [id number]   No orders of the defined types were placed in this encounter.   Patient Instructions  Medication Instructions:  No changes *If you need a refill on your cardiac medications before your next appointment, please call your pharmacy*  Lab Work: Lipid Panel, CMET- Today If you have labs (blood work) drawn today and your tests are completely normal, you will receive your results only by: [place] (if you have MyChart) OR A paper copy in the mail If you have any lab test that is abnormal or we need to change your treatment, we will call you to review the results.  Follow-Up: At [facility], you and your health needs are  our priority.  As part of our continuing mission to provide you with exceptional heart care, we have created designated Provider Care Teams.  These Care Teams include your primary Cardiologist (physician) and Advanced Practice Providers (APPs -  Physician Assistants and Nurse Practitioners) who all work together to provide you with the care you need, when you need it.  We recommend signing up for the patient portal called "MyChart".  Sign up information is provided on this After Visit Summary.  MyChart is used to connect with patients for Virtual Visits (Telemedicine).  Patients are able to view lab/test results, encounter notes, upcoming appointments, etc.  Non-urgent messages can be sent to your provider as well.   To learn more about what you can do with MyChart, go to [email or web address].    Your next appointment:   1 year(s)  Provider:   Sanda Klein, MD         Signed, Sanda Klein, MD  09/24/2022 3:31 PM    [place]

## 2022-09-25 ENCOUNTER — Telehealth: Payer: Self-pay | Admitting: Emergency Medicine

## 2022-09-25 DIAGNOSIS — I10 Essential (primary) hypertension: Secondary | ICD-10-CM

## 2022-09-25 DIAGNOSIS — E7800 Pure hypercholesterolemia, unspecified: Secondary | ICD-10-CM

## 2022-09-25 LAB — COMPREHENSIVE METABOLIC PANEL
ALT: 23 IU/L (ref 0–44)
AST: 23 IU/L (ref 0–40)
Albumin/Globulin Ratio: 2 (ref 1.2–2.2)
Albumin: 4.5 g/dL (ref 3.7–4.7)
Alkaline Phosphatase: 96 IU/L (ref 44–121)
BUN/Creatinine Ratio: 18 (ref 10–24)
BUN: 20 mg/dL (ref 8–27)
Bilirubin Total: 0.7 mg/dL (ref 0.0–1.2)
CO2: 30 mmol/L — ABNORMAL HIGH (ref 20–29)
Calcium: 9.1 mg/dL (ref 8.6–10.2)
Chloride: 97 mmol/L (ref 96–106)
Creatinine, Ser: 1.1 mg/dL (ref 0.76–1.27)
Globulin, Total: 2.3 g/dL (ref 1.5–4.5)
Glucose: 96 mg/dL (ref 70–99)
Potassium: 5.5 mmol/L — ABNORMAL HIGH (ref 3.5–5.2)
Sodium: 137 mmol/L (ref 134–144)
Total Protein: 6.8 g/dL (ref 6.0–8.5)
eGFR: 67 mL/min/{1.73_m2} (ref >59)

## 2022-09-25 LAB — LIPID PANEL
Chol/HDL Ratio: 2.1 ratio (ref 0.0–5.0)
Cholesterol, Total: 116 mg/dL (ref 100–199)
HDL: 55 mg/dL (ref >39)
LDL Chol Calc (NIH): 49 mg/dL (ref 0–99)
Triglycerides: 48 mg/dL (ref 0–149)
VLDL Cholesterol Cal: 12 mg/dL (ref 5–40)

## 2022-09-25 MED ORDER — LOSARTAN POTASSIUM 25 MG PO TABS: 25.0000 mg | ORAL_TABLET | Freq: Every day | ORAL | 3 refills | Status: DC

## 2022-09-25 NOTE — Telephone Encounter (Signed)
  Potassium is borderline high.  Sometimes we see this on blood samples in our lab due to hemolysis (down of red blood cells in the tube), but similar values at the upper limit of normal or slightly high have been recorded over the last 3 years.  Since blood pressure control has been excellent, I would recommend dropping the dose of losartan in half, to 25 mg once daily.  I would then recheck a basic metabolic panel in roughly a month.   Called pt and his wife answered, he is out in the field working. I gave her the information from Dr Sallyanne Kuster, listed above. Sent in RX for reduced dose of Losartan.   Told to call or MyChart message with any questions/concerns

## 2022-10-12 ENCOUNTER — Other Ambulatory Visit: Payer: Self-pay | Admitting: Cardiovascular Disease

## 2022-10-27 ENCOUNTER — Encounter: Payer: Self-pay | Admitting: Cardiovascular Disease

## 2022-10-28 ENCOUNTER — Other Ambulatory Visit: Payer: Self-pay

## 2022-10-28 DIAGNOSIS — I10 Essential (primary) hypertension: Secondary | ICD-10-CM

## 2022-10-28 DIAGNOSIS — E7800 Pure hypercholesterolemia, unspecified: Secondary | ICD-10-CM

## 2022-10-29 LAB — BASIC METABOLIC PANEL
BUN/Creatinine Ratio: 19 (ref 10–24)
BUN: 19 mg/dL (ref 8–27)
CO2: 29 mmol/L (ref 20–29)
Calcium: 9.3 mg/dL (ref 8.6–10.2)
Chloride: 98 mmol/L (ref 96–106)
Creatinine, Ser: 0.99 mg/dL (ref 0.76–1.27)
Glucose: 107 mg/dL — ABNORMAL HIGH (ref 70–99)
Potassium: 5 mmol/L (ref 3.5–5.2)
Sodium: 140 mmol/L (ref 134–144)
eGFR: 76 mL/min/{1.73_m2} (ref >59)

## 2023-02-11 ENCOUNTER — Other Ambulatory Visit: Payer: Self-pay | Admitting: Cardiovascular Disease

## 2023-03-07 ENCOUNTER — Other Ambulatory Visit: Payer: Self-pay | Admitting: Cardiovascular Disease

## 2023-03-11 DIAGNOSIS — L989 Disorder of the skin and subcutaneous tissue, unspecified: Secondary | ICD-10-CM | POA: Diagnosis not present

## 2023-03-19 DIAGNOSIS — Z23 Encounter for immunization: Secondary | ICD-10-CM | POA: Diagnosis not present

## 2023-04-15 DIAGNOSIS — E7800 Pure hypercholesterolemia, unspecified: Secondary | ICD-10-CM | POA: Diagnosis not present

## 2023-04-15 DIAGNOSIS — D696 Thrombocytopenia, unspecified: Secondary | ICD-10-CM | POA: Diagnosis not present

## 2023-04-15 DIAGNOSIS — Z0000 Encounter for general adult medical examination without abnormal findings: Secondary | ICD-10-CM | POA: Diagnosis not present

## 2023-04-15 DIAGNOSIS — I10 Essential (primary) hypertension: Secondary | ICD-10-CM | POA: Diagnosis not present

## 2023-04-15 DIAGNOSIS — R0989 Other specified symptoms and signs involving the circulatory and respiratory systems: Secondary | ICD-10-CM | POA: Diagnosis not present

## 2023-04-15 DIAGNOSIS — D492 Neoplasm of unspecified behavior of bone, soft tissue, and skin: Secondary | ICD-10-CM | POA: Diagnosis not present

## 2023-04-15 DIAGNOSIS — Z1331 Encounter for screening for depression: Secondary | ICD-10-CM | POA: Diagnosis not present

## 2023-04-15 DIAGNOSIS — I25111 Atherosclerotic heart disease of native coronary artery with angina pectoris with documented spasm: Secondary | ICD-10-CM | POA: Diagnosis not present

## 2023-04-15 DIAGNOSIS — Z23 Encounter for immunization: Secondary | ICD-10-CM | POA: Diagnosis not present

## 2023-04-15 DIAGNOSIS — K219 Gastro-esophageal reflux disease without esophagitis: Secondary | ICD-10-CM | POA: Diagnosis not present

## 2023-04-15 DIAGNOSIS — I252 Old myocardial infarction: Secondary | ICD-10-CM | POA: Diagnosis not present

## 2023-09-17 ENCOUNTER — Other Ambulatory Visit: Payer: Self-pay | Admitting: Cardiovascular Disease

## 2023-09-29 ENCOUNTER — Encounter: Payer: Self-pay | Admitting: Cardiovascular Disease

## 2023-09-29 ENCOUNTER — Ambulatory Visit: Payer: Medicare Other | Attending: Cardiovascular Disease | Admitting: Cardiovascular Disease

## 2023-09-29 VITALS — BP 130/82 | HR 61 | Ht 69.0 in | Wt 146.8 lb

## 2023-09-29 DIAGNOSIS — I10 Essential (primary) hypertension: Secondary | ICD-10-CM | POA: Insufficient documentation

## 2023-09-29 DIAGNOSIS — I2510 Atherosclerotic heart disease of native coronary artery without angina pectoris: Secondary | ICD-10-CM | POA: Insufficient documentation

## 2023-09-29 DIAGNOSIS — E7800 Pure hypercholesterolemia, unspecified: Secondary | ICD-10-CM | POA: Insufficient documentation

## 2023-09-29 LAB — COMPREHENSIVE METABOLIC PANEL
ALT: 21 IU/L (ref 0–44)
AST: 24 IU/L (ref 0–40)
Albumin: 4.5 g/dL (ref 3.7–4.7)
Alkaline Phosphatase: 104 IU/L (ref 44–121)
BUN/Creatinine Ratio: 19 (ref 10–24)
BUN: 19 mg/dL (ref 8–27)
Bilirubin Total: 0.9 mg/dL (ref 0.0–1.2)
CO2: 28 mmol/L (ref 20–29)
Calcium: 9.2 mg/dL (ref 8.6–10.2)
Chloride: 94 mmol/L — ABNORMAL LOW (ref 96–106)
Creatinine, Ser: 1 mg/dL (ref 0.76–1.27)
Globulin, Total: 2.7 g/dL (ref 1.5–4.5)
Glucose: 93 mg/dL (ref 70–99)
Potassium: 5 mmol/L (ref 3.5–5.2)
Sodium: 135 mmol/L (ref 134–144)
Total Protein: 7.2 g/dL (ref 6.0–8.5)
eGFR: 74 mL/min/{1.73_m2} (ref >59)

## 2023-09-29 LAB — LIPID PANEL
Chol/HDL Ratio: 2.1 ratio (ref 0.0–5.0)
Cholesterol, Total: 118 mg/dL (ref 100–199)
HDL: 56 mg/dL (ref >39)
LDL Chol Calc (NIH): 51 mg/dL (ref 0–99)
Triglycerides: 47 mg/dL (ref 0–149)
VLDL Cholesterol Cal: 11 mg/dL (ref 5–40)

## 2023-09-29 NOTE — Progress Notes (Signed)
 Cardiology Office Note:    Date:  10/03/2023   ID:  [name], DOB 05/14/1941, MRN [id number]  PCP:  Tally Joe, MD  Cardiologist:  Thurmon Fair, MD   Referring MD: Tally Joe, MD   Chief Complaint  Patient presents with   Coronary Artery Disease     History of Present Illness:    [name] is a 83 y.o. male with a hx of coronary artery disease presenting with a very small non-STEMI in January 2019, followed by two-vessel revascularization with placement of drug-eluting stents to the mid LAD artery (overlapping STENT SYNERGY DES 2.25X32 (2.4 mm) & STENT SYNERGY DES 2.75X16. (3.25 mm), with rescue angioplasty of the second diagonal artery) and left circumflex coronary artery (STENT SYNERGY DES 2.5X12.).  Left ventricular systolic function was mildly depressed with an EF of 45% at presentation, but a repeat echocardiogram performed in April 2019 showed complete normalization of wall motion and overall EF.  He also has long-standing, but well treated hypertension.   He has no cardiovascular complaints and remains very active.  Since his wife is partially disabled following a stroke he takes care of virtually all the household chores as well as a small farm (llamas, chicken)  The patient specifically denies any chest pain at rest or with exertion, dyspnea at rest or with exertion, orthopnea, paroxysmal nocturnal dyspnea, syncope, palpitations, focal neurological deficits, intermittent claudication, lower extremity edema, unexplained weight gain, cough, hemoptysis or wheezing.  He brought in a detailed log of his blood pressure at home.  This is consistent in the 110-120/60-70 range.  His heart rate is usually in the 50s or low 60s.  He remains very lean and fit with a BMI less than 22.  His most recent metabolic profile is excellent with an HDL of 48, LDL 50, normal triglycerides.  He does not have diabetes mellitus and has normal renal function.  His previous angina was  a heartburn-like sensation that radiated to his right shoulder.    Past Medical History:  Diagnosis Date   Hyperlipidemia    Hypertension    NSTEMI (non-ST elevated myocardial infarction) ([facility]) 08/16/2017   s/p PCI/DES to LAD x2, and Lcx x1. Normal EF    Past Surgical History:  Procedure Laterality Date   CORONARY BALLOON ANGIOPLASTY N/A 08/17/2017   Procedure: CORONARY BALLOON ANGIOPLASTY;  Surgeon: [name], MD;  Location: [facility];  Service: Cardiovascular;  Laterality: N/A;   CORONARY STENT INTERVENTION N/A 08/17/2017   Procedure: CORONARY STENT INTERVENTION;  Surgeon: [name], MD;  Location: [facility];  Service: Cardiovascular;  Laterality: N/A;   LEFT HEART CATH AND CORONARY ANGIOGRAPHY N/A 08/17/2017   Procedure: LEFT HEART CATH AND CORONARY ANGIOGRAPHY;  Surgeon: [name], MD;  Location: [facility];  Service: Cardiovascular;  Laterality: N/A;    Current Medications: Current Meds  Medication Sig   aspirin EC 81 MG tablet Take 1 tablet (81 mg total) by mouth daily.   atorvastatin (LIPITOR) 80 MG tablet TAKE 1 TABLET BY MOUTH DAILY AT 6 PM.   hydrochlorothiazide (HYDRODIURIL) 12.5 MG tablet TAKE 1 TABLET BY MOUTH EVERY DAY   losartan (COZAAR) 25 MG tablet TAKE 1 TABLET (25 MG TOTAL) BY MOUTH DAILY.   metoprolol succinate (TOPROL-XL) 25 MG 24 hr tablet TAKE 1/2 TABLET BY MOUTH DAILY TAKE WITH OR IMMEDIATELY FOLLOWING A MEAL     Allergies:   Orange fruit [citrus], Penicillin v potassium, Penicillins, and Wine [alcohol]  Family History: The patient's family history is significantly negative for early onset coronary/vascular disease.   EKGs/Labs/Other Studies Reviewed:    The following studies were reviewed today:  ECHO 11/12/2017 - Left ventricle: The cavity size was normal. Wall thickness was   normal. Systolic function was normal. The estimated ejection   fraction was in the range of 55% to 60%. Wall motion was  normal;   there were no regional wall motion abnormalities. Left   ventricular diastolic function parameters were normal. - Mitral valve: There was mild regurgitation.  Cardiac catheterization August 17 2017 There is mild left ventricular systolic dysfunction. The left ventricular ejection fraction is 45-50% by visual estimate. LV end diastolic pressure is normal. ------------------------------------------------- Mid Cx lesion is 99% stenosed. A drug-eluting stent was successfully placed using a STENT SYNERGY DES 2.5X12. Post intervention, there is a 0% residual stenosis. _____________________________________________________________________ Mid LAD-1 lesion is 95% stenosed. Mid LAD-2 lesion is 90% stenosed. Mid LAD to Dist LAD lesion is 80% stenosed. 2 Overlapping drug-eluting stents were successfully placed using a STENT SYNERGY DES 2.25X32 (2.4 mm) & STENT SYNERGY DES 2.75X16. (3.25 mm) Post intervention, there is a 0% residual stenosis. _____________________________________________________________________ Suezanne Jacquet 2nd Diag lesion is 99% stenosed. (Bifurcation lesion - planned PTCA only) Balloon angioplasty was performed using a BALLOON WOLVERINE 2.50X10. Post intervention, there is a 50% residual stenosis.   Successful multisite PCI of the circumflex and LAD with PTCA of the D2. Mildly reduced EF with inferolateral hypokinesis.    ECHO 09/13/2019:     1. Left ventricular ejection fraction, by estimation, is 55 to 60%. The  left ventricle has normal function. The left ventricle has no regional  wall motion abnormalities. Left ventricular diastolic parameters were  normal.   2. Right ventricular systolic function is normal. The right ventricular  size is normal. Tricuspid regurgitation signal is inadequate for assessing  PA pressure.   3. The mitral valve is normal in structure and function. Trivial mitral  valve regurgitation. No evidence of mitral stenosis.   4. The aortic valve  is tricuspid. Aortic valve regurgitation is not  visualized. No aortic stenosis is present.   5. The inferior vena cava is normal in size with greater than 50%  respiratory variability, suggesting right atrial pressure of 3 mmHg.      EKG:    EKG Interpretation Date/Time:  Wednesday September 29 2023 09:37:32 EDT Ventricular Rate:  61 PR Interval:  162 QRS Duration:  88 QT Interval:  410 QTC Calculation: 412 R Axis:   65  Text Interpretation: Sinus rhythm with Premature atrial complexes Nonspecific ST and T wave abnormality When compared with ECG of 18-Aug-2017 05:18, Premature atrial complexes are now Present Confirmed by Britt Theard [phone]) on 09/29/2023 9:58:24 AM         Recent Labs: 09/29/2023: ALT 21; BUN 19; Creatinine, Ser 1.00; Potassium 5.0; Sodium 135  Recent Lipid Panel    Component Value Date/Time   CHOL 118 09/29/2023 1025   TRIG 47 09/29/2023 1025   HDL 56 09/29/2023 1025   CHOLHDL 2.1 09/29/2023 1025   CHOLHDL 3.5 08/17/2017 0407   VLDL 8 08/17/2017 0407   LDLCALC 51 09/29/2023 1025     Physical Exam:    VS:  BP 130/82 (BP Location: Left Arm, Patient Position: Sitting, Cuff Size: Normal)   Pulse 61   Ht 5\' 9"  (1.753 m)   Wt 146 lb 12.8 oz (66.6 kg)   SpO2 97%   BMI 21.68 kg/m  Wt Readings from Last 3 Encounters:  09/29/23 146 lb 12.8 oz (66.6 kg)  09/24/22 147 lb 9.6 oz (67 kg)  09/22/21 151 lb 12.8 oz (68.9 kg)      General: Alert, oriented x3, no distress, appears lean and fit, looks younger than stated age Head: no evidence of trauma, PERRL, EOMI, no exophtalmos or lid lag, no myxedema, no xanthelasma; normal ears, nose and oropharynx Neck: normal jugular venous pulsations and no hepatojugular reflux; brisk carotid pulses without delay and no carotid bruits Chest: clear to auscultation, no signs of consolidation by percussion or palpation, normal fremitus, symmetrical and full respiratory excursions Cardiovascular: normal position and  quality of the apical impulse, regular rhythm with rare superimposed ectopic beats, normal first and second heart sounds, no murmurs, rubs or gallops Abdomen: no tenderness or distention, no masses by palpation, no abnormal pulsatility or arterial bruits, normal bowel sounds, no hepatosplenomegaly Extremities: no clubbing, cyanosis or edema; 2+ radial, ulnar and brachial pulses bilaterally; 2+ right femoral, posterior tibial and dorsalis pedis pulses; 2+ left femoral, posterior tibial and dorsalis pedis pulses; no subclavian or femoral bruits Neurological: grossly nonfocal Psych: Normal mood and affect     ASSESSMENT:    1. Coronary artery disease involving native coronary artery of native heart without angina pectoris   2. Essential hypertension   3. Hypercholesterolemia      PLAN:    In order of problems listed above:  CAD: He did is not have angina despite being very physically active.  Aspirin lipid-lowering therapy and a very low-dose of beta-blockade to bradycardia.  Also note that he had more problems with shortness of breath when he was taking carvedilol, suggesting a possible component of reactive airway disease. HLP: Excellent lipid parameters.  Continue same medications. HTN: Normal blood pressure today and at home.  He has well-documented situational hypertension in the past.    Avoid amlodipine due to previous issues with edema.  Had some problems with cough while taking ACE inhibitors, although causality not clear.  Have had to keep him on low-dose beta-blocker due to bradycardia. PACs: Asymptomatic.  No treatment recommended.  Medication Adjustments/Labs and Tests Ordered: Current medicines are reviewed at length with the patient today.  Concerns regarding medicines are outlined above.  Orders Placed This Encounter  Procedures   Lipid panel   Comprehensive metabolic panel   EKG 12-Lead   No orders of the defined types were placed in this encounter.   Patient  Instructions  Medication Instructions:  No changes.  *If you need a refill on your cardiac medications before your next appointment, please call your pharmacy*   Lab Work: CMET, Lipid today If you have labs (blood work) drawn today and your tests are completely normal, you will receive your results only by: MyChart Message (if you have MyChart) OR A paper copy in the mail If you have any lab test that is abnormal or we need to change your treatment, we will call you to review the results.   Follow-Up: At [facility], you and your health needs are our priority.  As part of our continuing mission to provide you with exceptional heart care, we have created designated Provider Care Teams.  These Care Teams include your primary Cardiologist (physician) and Advanced Practice Providers (APPs -  Physician Assistants and Nurse Practitioners) who all work together to provide you with the care you need, when you need it.  We recommend signing up for the patient portal called "MyChart".  Sign  up information is provided on this After Visit Summary.  MyChart is used to connect with patients for Virtual Visits (Telemedicine).  Patients are able to view lab/test results, encounter notes, upcoming appointments, etc.  Non-urgent messages can be sent to your provider as well.   To learn more about what you can do with MyChart, go to [email or web address].    Your next appointment:   1 year(s)  Provider:   Thurmon Fair, MD     Other Instructions          Signed, Thurmon Fair, MD  10/03/2023 10:14 PM    [facility]

## 2023-09-29 NOTE — Patient Instructions (Signed)
 Medication Instructions:  No changes.  *If you need a refill on your cardiac medications before your next appointment, please call your pharmacy*   Lab Work: CMET, Lipid today If you have labs (blood work) drawn today and your tests are completely normal, you will receive your results only by: MyChart Message (if you have MyChart) OR A paper copy in the mail If you have any lab test that is abnormal or we need to change your treatment, we will call you to review the results.   Follow-Up: At [facility], you and your health needs are our priority.  As part of our continuing mission to provide you with exceptional heart care, we have created designated Provider Care Teams.  These Care Teams include your primary Cardiologist (physician) and Advanced Practice Providers (APPs -  Physician Assistants and Nurse Practitioners) who all work together to provide you with the care you need, when you need it.  We recommend signing up for the patient portal called "MyChart".  Sign up information is provided on this After Visit Summary.  MyChart is used to connect with patients for Virtual Visits (Telemedicine).  Patients are able to view lab/test results, encounter notes, upcoming appointments, etc.  Non-urgent messages can be sent to your provider as well.   To learn more about what you can do with MyChart, go to [email or web address].    Your next appointment:   1 year(s)  Provider:   Thurmon Fair, MD     Other Instructions

## 2023-09-30 ENCOUNTER — Encounter: Payer: Self-pay | Admitting: Cardiovascular Disease

## 2023-10-03 ENCOUNTER — Encounter: Payer: Self-pay | Admitting: Cardiovascular Disease

## 2023-10-06 ENCOUNTER — Other Ambulatory Visit: Payer: Self-pay | Admitting: Cardiovascular Disease

## 2023-10-31 ENCOUNTER — Other Ambulatory Visit: Payer: Self-pay | Admitting: Cardiovascular Disease

## 2023-12-02 ENCOUNTER — Other Ambulatory Visit: Payer: Self-pay | Admitting: Cardiovascular Disease

## 2023-12-12 ENCOUNTER — Other Ambulatory Visit: Payer: Self-pay | Admitting: Cardiovascular Disease

## 2024-02-03 ENCOUNTER — Encounter: Payer: Self-pay | Admitting: Cardiovascular Disease

## 2024-03-24 ENCOUNTER — Encounter: Payer: Self-pay | Admitting: Cardiovascular Disease

## 2024-04-17 DIAGNOSIS — Z23 Encounter for immunization: Secondary | ICD-10-CM | POA: Diagnosis not present

## 2024-10-06 ENCOUNTER — Ambulatory Visit: Admitting: Cardiovascular Disease
